# Patient Record
Sex: Male | Born: 1960 | Race: White | Hispanic: No | Marital: Married | Smoking: Former smoker
Health system: Southern US, Community
[De-identification: ages and names within clinical notes are randomized; demographics above are authoritative.]

## PROBLEM LIST (undated history)

## (undated) DIAGNOSIS — D649 Anemia, unspecified: Secondary | ICD-10-CM

## (undated) DIAGNOSIS — R112 Nausea with vomiting, unspecified: Secondary | ICD-10-CM

## (undated) DIAGNOSIS — K267 Chronic duodenal ulcer without hemorrhage or perforation: Principal | ICD-10-CM

## (undated) DIAGNOSIS — N200 Calculus of kidney: Secondary | ICD-10-CM

## (undated) DIAGNOSIS — E559 Vitamin D deficiency, unspecified: Secondary | ICD-10-CM

## (undated) DIAGNOSIS — K219 Gastro-esophageal reflux disease without esophagitis: Secondary | ICD-10-CM

## (undated) DIAGNOSIS — Z87442 Personal history of urinary calculi: Secondary | ICD-10-CM

## (undated) DIAGNOSIS — T7840XA Allergy, unspecified, initial encounter: Secondary | ICD-10-CM

## (undated) DIAGNOSIS — I2510 Atherosclerotic heart disease of native coronary artery without angina pectoris: Secondary | ICD-10-CM

## (undated) DIAGNOSIS — K311 Adult hypertrophic pyloric stenosis: Principal | ICD-10-CM

## (undated) DIAGNOSIS — J189 Pneumonia, unspecified organism: Secondary | ICD-10-CM

## (undated) DIAGNOSIS — K5790 Diverticulosis of intestine, part unspecified, without perforation or abscess without bleeding: Secondary | ICD-10-CM

## (undated) DIAGNOSIS — K279 Peptic ulcer, site unspecified, unspecified as acute or chronic, without hemorrhage or perforation: Secondary | ICD-10-CM

## (undated) DIAGNOSIS — M545 Low back pain: Secondary | ICD-10-CM

## (undated) DIAGNOSIS — D126 Benign neoplasm of colon, unspecified: Secondary | ICD-10-CM

## (undated) DIAGNOSIS — T1490XA Injury, unspecified, initial encounter: Secondary | ICD-10-CM

## (undated) DIAGNOSIS — T4145XA Adverse effect of unspecified anesthetic, initial encounter: Secondary | ICD-10-CM

## (undated) DIAGNOSIS — K859 Acute pancreatitis without necrosis or infection, unspecified: Secondary | ICD-10-CM

## (undated) DIAGNOSIS — I10 Essential (primary) hypertension: Secondary | ICD-10-CM

## (undated) DIAGNOSIS — Z9889 Other specified postprocedural states: Secondary | ICD-10-CM

## (undated) DIAGNOSIS — I219 Acute myocardial infarction, unspecified: Secondary | ICD-10-CM

## (undated) DIAGNOSIS — R7309 Other abnormal glucose: Secondary | ICD-10-CM

## (undated) DIAGNOSIS — S72009A Fracture of unspecified part of neck of unspecified femur, initial encounter for closed fracture: Secondary | ICD-10-CM

## (undated) DIAGNOSIS — T8859XA Other complications of anesthesia, initial encounter: Secondary | ICD-10-CM

## (undated) HISTORY — DX: Low back pain: M54.5

## (undated) HISTORY — DX: Anemia, unspecified: D64.9

## (undated) HISTORY — DX: Chronic duodenal ulcer without hemorrhage or perforation: K26.7

## (undated) HISTORY — DX: Benign neoplasm of colon, unspecified: D12.6

## (undated) HISTORY — DX: Adult hypertrophic pyloric stenosis: K31.1

## (undated) HISTORY — PX: COLONOSCOPY W/ POLYPECTOMY: SHX1380

## (undated) HISTORY — DX: Diverticulosis of intestine, part unspecified, without perforation or abscess without bleeding: K57.90

## (undated) HISTORY — DX: Vitamin D deficiency, unspecified: E55.9

## (undated) HISTORY — DX: Other abnormal glucose: R73.09

## (undated) HISTORY — DX: Fracture of unspecified part of neck of unspecified femur, initial encounter for closed fracture: S72.009A

## (undated) HISTORY — DX: Acute pancreatitis without necrosis or infection, unspecified: K85.90

## (undated) HISTORY — DX: Calculus of kidney: N20.0

## (undated) HISTORY — DX: Injury, unspecified, initial encounter: T14.90XA

## (undated) HISTORY — DX: Essential (primary) hypertension: I10

## (undated) HISTORY — DX: Allergy, unspecified, initial encounter: T78.40XA

## (undated) HISTORY — DX: Gastro-esophageal reflux disease without esophagitis: K21.9

---

## 1991-07-08 HISTORY — PX: KNEE ARTHROSCOPY: SUR90

## 2006-07-07 HISTORY — PX: CHOLECYSTECTOMY: SHX55

## 2009-07-07 DIAGNOSIS — M545 Low back pain, unspecified: Secondary | ICD-10-CM

## 2009-07-07 HISTORY — DX: Low back pain, unspecified: M54.50

## 2011-07-08 DIAGNOSIS — D126 Benign neoplasm of colon, unspecified: Secondary | ICD-10-CM

## 2011-07-08 HISTORY — DX: Benign neoplasm of colon, unspecified: D12.6

## 2012-02-18 ENCOUNTER — Encounter: Payer: Self-pay | Admitting: Internal Medicine

## 2012-03-31 ENCOUNTER — Encounter: Payer: Self-pay | Admitting: Internal Medicine

## 2012-04-02 ENCOUNTER — Encounter: Payer: Self-pay | Admitting: Internal Medicine

## 2012-04-02 ENCOUNTER — Ambulatory Visit (AMBULATORY_SURGERY_CENTER): Payer: BC Managed Care – PPO | Admitting: *Deleted

## 2012-04-02 VITALS — Ht 73.0 in | Wt 230.0 lb

## 2012-04-02 DIAGNOSIS — Z1211 Encounter for screening for malignant neoplasm of colon: Secondary | ICD-10-CM

## 2012-04-02 MED ORDER — SUPREP BOWEL PREP KIT 17.5-3.13-1.6 GM/177ML PO SOLN: ORAL | Status: DC

## 2012-04-16 ENCOUNTER — Ambulatory Visit (AMBULATORY_SURGERY_CENTER): Payer: BC Managed Care – PPO | Admitting: Internal Medicine

## 2012-04-16 ENCOUNTER — Encounter: Payer: Self-pay | Admitting: Internal Medicine

## 2012-04-16 VITALS — BP 151/87 | HR 71 | Temp 98.1°F | Resp 43 | Ht 73.0 in | Wt 230.0 lb

## 2012-04-16 DIAGNOSIS — Z1211 Encounter for screening for malignant neoplasm of colon: Secondary | ICD-10-CM

## 2012-04-16 DIAGNOSIS — D126 Benign neoplasm of colon, unspecified: Secondary | ICD-10-CM

## 2012-04-16 MED ORDER — SODIUM CHLORIDE 0.9 % IV SOLN: 500.0000 mL | INTRAVENOUS | Status: DC

## 2012-04-16 NOTE — Op Note (Signed)
[facility] [address] [employer] [place], [address]   COLONOSCOPY PROCEDURE REPORT  PATIENT: Caleb Mason, Caleb Mason  MR#: [id number] BIRTHDATE: [birth year] , 51  yrs. old GENDER: Male ENDOSCOPIST: Iva Boop, MD, [facility] REFERRED BY:  Donnetta Hutching, MD PROCEDURE DATE:  04/16/2012 PROCEDURE:   Colonoscopy with snare polypectomy ASA CLASS:   Class II INDICATIONS:average risk screening. MEDICATIONS: propofol (Diprivan) 300mg  IV  DESCRIPTION OF PROCEDURE:   After the risks benefits and alternatives of the procedure were thoroughly explained, informed consent was obtained.  A digital rectal exam revealed no abnormalities of the rectum and A digital rectal exam revealed the prostate was not enlarged.   The LB CF-[id number]  endoscope was introduced through the anus and advanced to the cecum, which was identified by both the appendix and ileocecal valve. No adverse events experienced.   The quality of the prep was Suprep excellent The instrument was then slowly withdrawn as the colon was fully examined.      COLON FINDINGS: A polypoid shaped sessile polyp measuring 6 mm in size was found in the sigmoid colon.  A polypectomy was performed with a cold snare.  The resection was complete and the polyp tissue was completely retrieved.   Moderate diverticulosis was noted in the sigmoid colon.   The colon mucosa was otherwise normal. Retroflexed views revealed no abnormalities. The time to cecum=1 minutes 57 seconds.  Withdrawal time=9 minutes 06 seconds.  The scope was withdrawn and the procedure completed. COMPLICATIONS: There were no complications.  ENDOSCOPIC IMPRESSION: 1.   Sessile polyp measuring 6 mm in size was found in the sigmoid colon; polypectomy was performed with a cold snare 2.   Moderate diverticulosis was noted in the sigmoid colon 3.   The colon mucosa was otherwise normal with excellent prep  RECOMMENDATIONS: Timing of repeat colonoscopy will be  determined by pathology findings.   eSigned:  Iva Boop, MD, [facility] 04/16/2012 11:38 AM   cc: The Patient    and Donnetta Hutching, MD   PATIENT NAMEJaizon, Caleb Mason MR#: [id number]

## 2012-04-16 NOTE — Patient Instructions (Addendum)
One benign-appearing polyp was removed. You also have diverticulosis, which is common and not usually a problem.  Please read the patient education handouts.  Thank you for choosing me and [facility].  Iva Boop, MD, FACG   YOU HAD AN ENDOSCOPIC PROCEDURE TODAY AT THE [facility]: Refer to the procedure report that was given to you for any specific questions about what was found during the examination.  If the procedure report does not answer your questions, please call your gastroenterologist to clarify.  If you requested that your care partner not be given the details of your procedure findings, then the procedure report has been included in a sealed envelope for you to review at your convenience later.  YOU SHOULD EXPECT: Some feelings of bloating in the abdomen. Passage of more gas than usual.  Walking can help get rid of the air that was put into your GI tract during the procedure and reduce the bloating. If you had a lower endoscopy (such as a colonoscopy or flexible sigmoidoscopy) you may notice spotting of blood in your stool or on the toilet paper. If you underwent a bowel prep for your procedure, then you may not have a normal bowel movement for a few days.  DIET: Your first meal following the procedure should be a light meal and then it is ok to progress to your normal diet.  A half-sandwich or bowl of soup is an example of a good first meal.  Heavy or fried foods are harder to digest and may make you feel nauseous or bloated.  Likewise meals heavy in dairy and vegetables can cause extra gas to form and this can also increase the bloating.  Drink plenty of fluids but you should avoid alcoholic beverages for 24 hours.  ACTIVITY: Your care partner should take you home directly after the procedure.  You should plan to take it easy, moving slowly for the rest of the day.  You can resume normal activity the day after the procedure however you should NOT DRIVE or  use heavy machinery for 24 hours (because of the sedation medicines used during the test).    SYMPTOMS TO REPORT IMMEDIATELY: A gastroenterologist can be reached at any hour.  During normal business hours, 8:30 AM to 5:00 PM Monday through Friday, call [phone].  After hours and on weekends, please call the GI answering service at [phone] who will take a message and have the physician on call contact you.   Following lower endoscopy (colonoscopy or flexible sigmoidoscopy):  Excessive amounts of blood in the stool  Significant tenderness or worsening of abdominal pains  Swelling of the abdomen that is new, acute  Fever of 100F or higher  FOLLOW UP: If any biopsies were taken you will be contacted by phone or by letter within the next 1-3 weeks.  Call your gastroenterologist if you have not heard about the biopsies in 3 weeks.  Our staff will call the home number listed on your records the next business day following your procedure to check on you and address any questions or concerns that you may have at that time regarding the information given to you following your procedure. This is a courtesy call and so if there is no answer at the home number and we have not heard from you through the emergency physician on call, we will assume that you have returned to your regular daily activities without incident.  SIGNATURES/CONFIDENTIALITY: You and/or your care partner have signed  paperwork which will be entered into your electronic medical record.  These signatures attest to the fact that that the information above on your After Visit Summary has been reviewed and is understood.  Full responsibility of the confidentiality of this discharge information lies with you and/or your care-partner.   Handouts on polyps, diverticulosis, high fiber diet

## 2012-04-16 NOTE — Progress Notes (Signed)
Patient did not experience any of the following events: a burn prior to discharge; a fall within the facility; wrong site/side/patient/procedure/implant event; or a hospital transfer or hospital admission upon discharge from the facility. (G8907) Patient did not have preoperative order for IV antibiotic SSI prophylaxis. (G8918)  

## 2012-04-16 NOTE — Progress Notes (Signed)
The pt tolerated the colonoscopy very well. Maw   

## 2012-04-19 ENCOUNTER — Telehealth: Payer: Self-pay | Admitting: *Deleted

## 2012-04-19 NOTE — Telephone Encounter (Signed)
  Follow up Call-  Call back number 04/16/2012  Post procedure Call Back phone  # [phone]  Permission to leave phone message Yes     Patient questions:  Busy signal x 2.

## 2012-04-20 ENCOUNTER — Encounter: Payer: Self-pay | Admitting: Internal Medicine

## 2012-04-20 DIAGNOSIS — Z860100 Personal history of colon polyps, unspecified: Secondary | ICD-10-CM | POA: Insufficient documentation

## 2012-04-20 NOTE — Progress Notes (Signed)
Quick Note:  6 mm adenoma Repeat colon 2018 ______

## 2012-09-14 ENCOUNTER — Encounter (HOSPITAL_BASED_OUTPATIENT_CLINIC_OR_DEPARTMENT_OTHER): Payer: Self-pay | Admitting: *Deleted

## 2012-09-14 ENCOUNTER — Other Ambulatory Visit: Payer: Self-pay | Admitting: Urology

## 2012-09-14 NOTE — Progress Notes (Signed)
Spoke with wife-to [facility] at 1045-Hg on arrival -Npo after MN-may take pain med with small amt if needed.

## 2012-09-14 NOTE — H&P (Signed)
Patient presents today for outpatient ureteroscopy with probable holmium laser lithotripsy and possible double-J stent placement. He was seen again recently in our office with ongoing flank pain and hematuria. He requested intervention. Recent KUB imaging revealed a difficult to see stone with ongoing hydronephrosis on ultrasound. We did not feel that ESWL was an option given the poor visualization of the stone. We discussed with him the risks and benefits of ureteroscopy and he wishes to proceed. 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 Caleb Mason is currently 52 years of age. He has had several episodes of nephrolithiasis, all with spontaneous passage. These have typically been more associated with excruciating renal colic. More recently he has had nonspecific back pain that has actually alternated sides. He has, however, developed gross hematuria without any dysuria or increased irritative or obstructive voiding symptoms. He has not had any recent imaging. Urinalysis in our office today shows too numerous to count red blood cells. There is no evidence of bacteruria. The patient also has a previous episode of pancreatitis, thought to be possibly secondary to gall stones. He has had his gallbladder removed. He does have a previous tobacco use history of approximately 50-pack-years, quitting about 12 years ago.      Past Medical History Problems  1. History of  Pancreatitis 577.9  Surgical History Problems  1. History of  Cholecystectomy 2. History of  Knee Surgery  Current Meds 1. No Reported Medications  Allergies Medication  1. No Known Drug Allergies  Family History Problems  1. Paternal history of  Family Health Status - Father's Age 35yrs 2. Maternal history of  Family Health  Status - Mother's Age 63yrs 3. Family history of  Family Health Status Number Of Children 2 sons 4. Maternal history of  Ovarian Cancer V16.41 5. Fraternal history of  Testicular Cancer V16.43  Social History Problems    Caffeine Use 4 qd   Former Smoker V15.82 2ppd for 66yrs, nonsmoker for the past 51yrs   Marital History - Currently Married   Occupation: maintence lead Denied    History of  Alcohol Use  Review of Systems Genitourinary, constitutional, skin, eye, otolaryngeal, hematologic/lymphatic, cardiovascular, pulmonary, endocrine, musculoskeletal, gastrointestinal, neurological and psychiatric system(s) were reviewed and pertinent findings if present are noted.  Genitourinary: urinary frequency, nocturia and hematuria.  Gastrointestinal: nausea and flank pain.  Musculoskeletal: back pain.    Vitals Vital Signs [Data Includes: Last 1 Day]  25Feb2014 10:12AM  BMI Calculated: 32.86 BSA Calculated: 2.29 Height: 5 ft 11.5 in Weight: 240 lb  Blood Pressure: 132 / 88 Temperature: 98.4 F Heart Rate: 68  Physical Exam Constitutional: Well nourished and well developed . No acute distress.  ENT:. The ears and nose are normal in appearance.  Neck: The appearance of the neck is normal and no neck mass is present.  Pulmonary: No respiratory distress and normal respiratory rhythm and effort.  Cardiovascular: Heart rate and rhythm are normal . No peripheral edema.  Abdomen: The abdomen is soft and nontender. No masses are palpated. No CVA tenderness. No hernias are palpable. No hepatosplenomegaly noted.  Rectal: Rectal exam demonstrates normal sphincter tone, no tenderness and no masses. Estimated prostate size is 1+. Normal rectal tone, no rectal masses, prostate is smooth, symmetric and non-tender. The prostate has no nodularity and is not  tender. The left seminal vesicle is nonpalpable. The right seminal vesicle is nonpalpable. The perineum is normal on inspection.   Genitourinary: Examination of the penis demonstrates no discharge, no masses, no lesions and a normal meatus. The scrotum is without lesions. The right epididymis is palpably normal and non-tender. The left epididymis is palpably normal and non-tender. The right testis is non-tender and without masses. The left testis is non-tender and without masses.  Lymphatics: The femoral and inguinal nodes are not enlarged or tender.  Skin: Normal skin turgor, no visible rash and no visible skin lesions.  Neuro/Psych:. Mood and affect are appropriate.    Results/Data Urine [Data Includes: Last 1 Day]   25Feb2014  COLOR AMBER   APPEARANCE CLOUDY   SPECIFIC GRAVITY 1.030   pH 6.0   GLUCOSE NEG mg/dL  BILIRUBIN NEG   KETONE NEG mg/dL  BLOOD LARGE   PROTEIN NEG mg/dL  UROBILINOGEN 0.2 mg/dL  NITRITE NEG   LEUKOCYTE ESTERASE NEG   SQUAMOUS EPITHELIAL/HPF RARE   WBC 0-2 WBC/hpf  RBC TNTC RBC/hpf  BACTERIA FEW   CRYSTALS Calcium Oxalate crystals noted   CASTS NONE SEEN    Assessment Assessed  1. Nephrolithiasis 592.0 2. Gross Hematuria 599.71 3. Ureteral Stone 592.1  Plan Gross Hematuria (599.71)  1. Tamsulosin HCl 0.4 MG Oral Capsule; TAKE 1 CAPSULE EVERY DAY; Therapy: 25Feb2014 to  (Last Rx:25Feb2014) 2. AU CT-STONE PROTOCOL  Done: 25Feb2014 12:00AM Ureteral Stone (592.1)  3. KUB  Requested for: 25Feb2014 4. Follow-up NP/PA Office  Follow-up  Requested for: 25Feb2014  Discussion/Summary   Mr. Caleb Mason underwent a stone protocol CT today. The official report from the radiologist is not yet back but what I have reviewed, he clearly has a 4 mm proximal right ureteral stone. It does not appear to be causing high-grade obstruction. There are no other renal or ureteral calculi noted. We will check the official report from the radiologist and let him know if there is any difference in their interpretation. In the interim, I do not see an absolute indication for intervention at this time. The  stone is difficult to see on the scout imaging because of some bowel gas. He hopefully will be able to pass this spontaneously. He does have pain medication at home, but we will put him on an alpha-blocker and provide him with a urine strainer. Followup with KUB with one of our physician extenders in 2-3 weeks and we will hear sooner if he is not doing well clinically. If the stone does not pass, then ESWL versus ureteroscopy will depend on how well the stone is seen on KUB and its location.    Signatures Electronically signed by : Barron Alvine, M.D.; Aug 31 2012  5:08PM

## 2012-09-15 ENCOUNTER — Encounter (HOSPITAL_BASED_OUTPATIENT_CLINIC_OR_DEPARTMENT_OTHER)
Admission: RE | Disposition: A | Discharge status: Home / Self Care | Payer: Self-pay | Source: Ambulatory Visit | Attending: Urology

## 2012-09-15 ENCOUNTER — Ambulatory Visit (HOSPITAL_BASED_OUTPATIENT_CLINIC_OR_DEPARTMENT_OTHER): Payer: BC Managed Care – PPO | Admitting: Anesthesiology

## 2012-09-15 ENCOUNTER — Ambulatory Visit (HOSPITAL_BASED_OUTPATIENT_CLINIC_OR_DEPARTMENT_OTHER)
Admission: RE | Admit: 2012-09-15 | Discharge: 2012-09-15 | Disposition: A | Discharge status: Home / Self Care | Payer: BC Managed Care – PPO | Source: Ambulatory Visit | Attending: Urology | Admitting: Urology

## 2012-09-15 ENCOUNTER — Encounter (HOSPITAL_BASED_OUTPATIENT_CLINIC_OR_DEPARTMENT_OTHER): Payer: Self-pay | Admitting: Anesthesiology

## 2012-09-15 DIAGNOSIS — N201 Calculus of ureter: Secondary | ICD-10-CM | POA: Insufficient documentation

## 2012-09-15 HISTORY — PX: CYSTOSCOPY WITH RETROGRADE PYELOGRAM, URETEROSCOPY AND STENT PLACEMENT: SHX5789

## 2012-09-15 HISTORY — PX: HOLMIUM LASER APPLICATION: SHX5852

## 2012-09-15 LAB — POCT HEMOGLOBIN-HEMACUE: Hemoglobin: 15 g/dL (ref 13.0–17.0)

## 2012-09-15 SURGERY — CYSTOURETEROSCOPY, WITH RETROGRADE PYELOGRAM AND STENT INSERTION
Anesthesia: General | Site: Ureter | Laterality: Right | Wound class: Clean Contaminated

## 2012-09-15 MED ORDER — BELLADONNA ALKALOIDS-OPIUM 16.2-60 MG RE SUPP
RECTAL | Status: DC | PRN
  Administered 2012-09-15: 1 via RECTAL

## 2012-09-15 MED ORDER — KETOROLAC TROMETHAMINE 30 MG/ML IJ SOLN
INTRAMUSCULAR | Status: DC | PRN
  Administered 2012-09-15: 30 mg via INTRAVENOUS

## 2012-09-15 MED ORDER — DEXAMETHASONE SODIUM PHOSPHATE 4 MG/ML IJ SOLN
INTRAMUSCULAR | Status: DC | PRN
  Administered 2012-09-15: 10 mg via INTRAVENOUS

## 2012-09-15 MED ORDER — LIDOCAINE HCL (CARDIAC) 20 MG/ML IV SOLN
INTRAVENOUS | Status: DC | PRN
  Administered 2012-09-15: 100 mg via INTRAVENOUS

## 2012-09-15 MED ORDER — URIBEL 118 MG PO CAPS: 1.0000 | ORAL_CAPSULE | Freq: Three times a day (TID) | ORAL | Status: DC | PRN

## 2012-09-15 MED ORDER — MIDAZOLAM HCL 5 MG/5ML IJ SOLN
INTRAMUSCULAR | Status: DC | PRN
  Administered 2012-09-15: 2 mg via INTRAVENOUS

## 2012-09-15 MED ORDER — PROPOFOL 10 MG/ML IV BOLUS
INTRAVENOUS | Status: DC | PRN
  Administered 2012-09-15: 350 mg via INTRAVENOUS

## 2012-09-15 MED ORDER — URELLE 81 MG PO TABS
1.0000 | ORAL_TABLET | Freq: Four times a day (QID) | ORAL | Status: DC
  Administered 2012-09-15: 81 mg via ORAL
  Filled 2012-09-15: qty 1

## 2012-09-15 MED ORDER — ONDANSETRON HCL 4 MG/2ML IJ SOLN
INTRAMUSCULAR | Status: DC | PRN
  Administered 2012-09-15: 4 mg via INTRAVENOUS

## 2012-09-15 MED ORDER — HYDROCODONE-ACETAMINOPHEN 5-325 MG PO TABS: 1.0000 | ORAL_TABLET | Freq: Four times a day (QID) | ORAL | Status: DC | PRN

## 2012-09-15 MED ORDER — LACTATED RINGERS IV SOLN
INTRAVENOUS | Status: DC
  Administered 2012-09-15: 11:00:00 via INTRAVENOUS
  Filled 2012-09-15: qty 1000

## 2012-09-15 MED ORDER — CIPROFLOXACIN IN D5W 400 MG/200ML IV SOLN
400.0000 mg | INTRAVENOUS | Status: AC
  Administered 2012-09-15: 400 mg via INTRAVENOUS
  Filled 2012-09-15: qty 200

## 2012-09-15 MED ORDER — LACTATED RINGERS IV SOLN
INTRAVENOUS | Status: DC
  Administered 2012-09-15: 14:00:00 via INTRAVENOUS
  Filled 2012-09-15: qty 1000

## 2012-09-15 MED ORDER — FENTANYL CITRATE 0.05 MG/ML IJ SOLN
25.0000 ug | INTRAMUSCULAR | Status: DC | PRN
  Filled 2012-09-15: qty 1

## 2012-09-15 MED ORDER — SODIUM CHLORIDE 0.9 % IR SOLN
Status: DC | PRN
  Administered 2012-09-15: 6000 mL

## 2012-09-15 MED ORDER — LIDOCAINE HCL 2 % EX GEL
CUTANEOUS | Status: DC | PRN
  Administered 2012-09-15: 1 via URETHRAL

## 2012-09-15 MED ORDER — IOHEXOL 300 MG/ML  SOLN
INTRAMUSCULAR | Status: DC | PRN
  Administered 2012-09-15: 10 mL

## 2012-09-15 MED ORDER — FENTANYL CITRATE 0.05 MG/ML IJ SOLN
INTRAMUSCULAR | Status: DC | PRN
  Administered 2012-09-15 (×2): 50 ug via INTRAVENOUS

## 2012-09-15 SURGICAL SUPPLY — 35 items
ADAPTER CATH URET PLST 4-6FR (CATHETERS) IMPLANT
BAG DRAIN URO-CYSTO SKYTR STRL (DRAIN) ×2 IMPLANT
BASKET LASER NITINOL 1.9FR (BASKET) IMPLANT
BASKET STNLS GEMINI 4WIRE 3FR (BASKET) IMPLANT
BASKET ZERO TIP NITINOL 2.4FR (BASKET) ×2 IMPLANT
BENZOIN TINCTURE PRP APPL 2/3 (GAUZE/BANDAGES/DRESSINGS) ×2 IMPLANT
CANISTER SUCT LVC 12 LTR MEDI- (MISCELLANEOUS) ×2 IMPLANT
CATH INTERMIT  6FR 70CM (CATHETERS) ×2 IMPLANT
CATH URET 5FR 28IN CONE TIP (BALLOONS)
CATH URET 5FR 28IN OPEN ENDED (CATHETERS) IMPLANT
CATH URET 5FR 70CM CONE TIP (BALLOONS) IMPLANT
CLOTH BEACON ORANGE TIMEOUT ST (SAFETY) ×2 IMPLANT
DRAPE CAMERA CLOSED 9X96 (DRAPES) ×2 IMPLANT
DRSG TEGADERM 2-3/8X2-3/4 SM (GAUZE/BANDAGES/DRESSINGS) ×2 IMPLANT
GLOVE BIO SURGEON STRL SZ7.5 (GLOVE) ×2 IMPLANT
GLOVE BIOGEL PI IND STRL 7.5 (GLOVE) ×1 IMPLANT
GLOVE BIOGEL PI INDICATOR 7.5 (GLOVE) ×1
GLOVE ECLIPSE 6.5 STRL STRAW (GLOVE) ×2 IMPLANT
GOWN STRL REIN XL XLG (GOWN DISPOSABLE) ×2 IMPLANT
GUIDEWIRE 0.038 PTFE COATED (WIRE) IMPLANT
GUIDEWIRE ANG ZIPWIRE 038X150 (WIRE) IMPLANT
GUIDEWIRE STR DUAL SENSOR (WIRE) ×2 IMPLANT
IV NS IRRIG 3000ML ARTHROMATIC (IV SOLUTION) ×4 IMPLANT
KIT BALLIN UROMAX 15FX10 (LABEL) IMPLANT
KIT BALLN UROMAX 15FX4 (MISCELLANEOUS) IMPLANT
KIT BALLN UROMAX 26 75X4 (MISCELLANEOUS)
LASER FIBER DISP (UROLOGICAL SUPPLIES) ×2 IMPLANT
LASER FIBER DISP 1000U (UROLOGICAL SUPPLIES) IMPLANT
NS IRRIG 500ML POUR BTL (IV SOLUTION) ×2 IMPLANT
PACK CYSTOSCOPY (CUSTOM PROCEDURE TRAY) ×2 IMPLANT
SET HIGH PRES BAL DIL (LABEL)
SHEATH ACCESS URETERAL 38CM (SHEATH) IMPLANT
SHEATH ACCESS URETERAL 54CM (SHEATH) IMPLANT
SHEATH URET ACCESS 12FR/35CM (UROLOGICAL SUPPLIES) ×2 IMPLANT
STENT URET 6FRX26 CONTOUR (STENTS) ×2 IMPLANT

## 2012-09-15 NOTE — Anesthesia Preprocedure Evaluation (Addendum)
Anesthesia Evaluation  Patient identified by MRN, date of birth, ID band Patient awake    Reviewed: Allergy & Precautions, H&P , NPO status , Patient's Chart, lab work & pertinent test results  Airway Mallampati: II TM Distance: >3 FB Neck ROM: full    Dental  (+) Dental Advisory Given and Chipped Chipped 2 front upper.  Right shorter than left.:   Pulmonary neg pulmonary ROS,  breath sounds clear to auscultation  Pulmonary exam normal       Cardiovascular Exercise Tolerance: Good negative cardio ROS  Rhythm:regular Rate:Normal     Neuro/Psych negative neurological ROS  negative psych ROS   GI/Hepatic negative GI ROS, Neg liver ROS,   Endo/Other  negative endocrine ROS  Renal/GU negative Renal ROS  negative genitourinary   Musculoskeletal   Abdominal   Peds  Hematology negative hematology ROS (+)   Anesthesia Other Findings   Reproductive/Obstetrics negative OB ROS                          Anesthesia Physical Anesthesia Plan  ASA: I  Anesthesia Plan: General   Post-op Pain Management:    Induction: Intravenous  Airway Management Planned: LMA  Additional Equipment:   Intra-op Plan:   Post-operative Plan:   Informed Consent: I have reviewed the patients History and Physical, chart, labs and discussed the procedure including the risks, benefits and alternatives for the proposed anesthesia with the patient or authorized representative who has indicated his/her understanding and acceptance.   Dental Advisory Given  Plan Discussed with: CRNA and Surgeon  Anesthesia Plan Comments:         Anesthesia Quick Evaluation

## 2012-09-15 NOTE — Anesthesia Procedure Notes (Signed)
Procedure Name: LMA Insertion Date/Time: 09/15/2012 11:58 AM Performed by: Norva Pavlov Pre-anesthesia Checklist: Patient identified, Emergency Drugs available, Suction available and Patient being monitored Patient Re-evaluated:Patient Re-evaluated prior to inductionOxygen Delivery Method: Circle System Utilized Preoxygenation: Pre-oxygenation with 100% oxygen Intubation Type: IV induction Ventilation: Mask ventilation without difficulty LMA: LMA inserted LMA Size: 5.0 Number of attempts: 1 Airway Equipment and Method: bite block Placement Confirmation: positive ETCO2 Tube secured with: Tape Dental Injury: Teeth and Oropharynx as per pre-operative assessment

## 2012-09-15 NOTE — Interval H&P Note (Signed)
History and Physical Interval Note:  09/15/2012 11:47 AM  Caleb Mason  has presented today for surgery, with the diagnosis of right ureteral stone  The various methods of treatment have been discussed with the patient and family. After consideration of risks, benefits and other options for treatment, the patient has consented to  Procedure(s): CYSTOSCOPY WITH RETROGRADE PYELOGRAM, URETEROSCOPY AND STENT PLACEMENT (Right) HOLMIUM LASER APPLICATION (Right) as a surgical intervention .  The patient's history has been reviewed, patient examined, no change in status, stable for surgery.  I have reviewed the patient's chart and labs.  Questions were answered to the patient's satisfaction.     GRAPEY,DAVID S

## 2012-09-15 NOTE — Anesthesia Postprocedure Evaluation (Signed)
  Anesthesia Post-op Note  Patient: Caleb Mason  Procedure(s) Performed: Procedure(s) (LRB): CYSTOSCOPY WITH right  RETROGRADE PYELOGRAM, right  URETEROSCOPY AND STENT PLACEMENT (Right) HOLMIUM LASER APPLICATION (Right)  Patient Location: PACU  Anesthesia Type: General  Level of Consciousness: awake and alert   Airway and Oxygen Therapy: Patient Spontanous Breathing  Post-op Pain: mild  Post-op Assessment: Post-op Vital signs reviewed, Patient's Cardiovascular Status Stable, Respiratory Function Stable, Patent Airway and No signs of Nausea or vomiting  Last Vitals:  Filed Vitals:   09/15/12 1315  BP: 133/72  Pulse: 64  Temp:   Resp: 10    Post-op Vital Signs: stable   Complications: No apparent anesthesia complications

## 2012-09-15 NOTE — Op Note (Signed)
Preoperative diagnosis: R ureteral calculus  Postoperative diagnosis: R ureteral calculus  Procedure:  1. Cystoscopy 2. R ureteroscopy and stone removal 3. Ureteroscopic laser lithotripsy 4. R ureteral stent placement (26CM) 71f 5. R retrograde pyelography with interpretation  Surgeon: Valetta Fuller, MD  Anesthesia: General  Complications: None  Intraoperative findings: R retrograde pyelography demonstrated a filling defect within the R ureter consistent with the patient's known calculus without other abnormalities.  EBL: Minimal  Specimens: 1. R ureteral calculus  Disposition of specimens: [facility] for stone analysis  Indication: Caleb Mason is a 52 y.o.   patient with urolithiasis. After reviewing the management options for treatment, the patient elected to proceed with the above surgical procedure(s). We have discussed the potential benefits and risks of the procedure, side effects of the proposed treatment, the likelihood of the patient achieving the goals of the procedure, and any potential problems that might occur during the procedure or recuperation. Informed consent has been obtained.  Description of procedure:  The patient was taken to the operating room and general anesthesia was induced.  The patient was placed in the dorsal lithotomy position, prepped and draped in the usual sterile fashion, and preoperative antibiotics were administered. A preoperative time-out was performed.   Cystourethroscopy was performed.  The patient's urethra was examined and was normal. The bladder was then systematically examined in its entirety. There was no evidence for any bladder tumors, stones, or other mucosal pathology.    Attention then turned to the R ureteral orifice and a ureteral catheter was used to intubate the ureteral orifice.  Omnipaque contrast was injected through the ureteral catheter and a retrograde pyelogram was performed with findings as dictated  above.  A 0.38 sensor guidewire was then advanced up the R ureter into the renal pelvis under fluoroscopic guidance. The 6 Fr semirigid ureteroscope was then advanced into the ureter next to the guidewire and the calculus was identified.   The stone was then fragmented with the 365 micron holmium laser fiber on a setting of 0.8J and frequency of 8 Hz.   All stones were then removed from the ureter with a zero tip nitinol basket.  Reinspection of the ureter revealed no remaining visible stones or fragments.   The wire was then backloaded through the cystoscope and a ureteral stent was advance over the wire using Seldinger technique.  The stent was positioned appropriately under fluoroscopic and cystoscopic guidance.  The wire was then removed with an adequate stent curl noted in the renal pelvis as well as in the bladder.  The bladder was then emptied and the procedure ended.  The patient appeared to tolerate the procedure well and without complications.  The patient was able to be awakened and transferred to the recovery unit in satisfactory condition.

## 2012-09-15 NOTE — Interval H&P Note (Signed)
History and Physical Interval Note:  09/15/2012 11:46 AM  Caleb Mason  has presented today for surgery, with the diagnosis of right ureteral stone  The various methods of treatment have been discussed with the patient and family. After consideration of risks, benefits and other options for treatment, the patient has consented to  Procedure(s): CYSTOSCOPY WITH RETROGRADE PYELOGRAM, URETEROSCOPY AND STENT PLACEMENT (Right) HOLMIUM LASER APPLICATION (Right) as a surgical intervention .  The patient's history has been reviewed, patient examined, no change in status, stable for surgery.  I have reviewed the patient's chart and labs.  Questions were answered to the patient's satisfaction.     GRAPEY,DAVID S

## 2012-09-15 NOTE — Transfer of Care (Signed)
Immediate Anesthesia Transfer of Care Note  Patient: Caleb Mason  Procedure(s) Performed: Procedure(s) (LRB): CYSTOSCOPY WITH right  RETROGRADE PYELOGRAM, right  URETEROSCOPY AND STENT PLACEMENT (Right) HOLMIUM LASER APPLICATION (Right)  Patient Location: Patient transported to PACU with oxygen via face mask at 4 Liters / Min  Anesthesia Type: General  Level of Consciousness: awake and alert   Airway & Oxygen Therapy: Patient Spontanous Breathing and Patient connected to face mask oxygen  Post-op Assessment: Report given to PACU RN and Post -op Vital signs reviewed and stable  Post vital signs: Reviewed and stable  Dentition: Teeth and oropharynx remain in pre-op condition  Complications: No apparent anesthesia complications

## 2012-09-16 ENCOUNTER — Encounter (HOSPITAL_BASED_OUTPATIENT_CLINIC_OR_DEPARTMENT_OTHER): Payer: Self-pay | Admitting: Urology

## 2012-09-16 NOTE — Addendum Note (Signed)
Addendum created 09/16/12 0744 by Fran Lowes, CRNA   Modules edited: Charges VN

## 2015-01-25 ENCOUNTER — Encounter: Payer: Self-pay | Admitting: Internal Medicine

## 2015-04-03 ENCOUNTER — Encounter: Payer: Self-pay | Admitting: Internal Medicine

## 2015-04-03 ENCOUNTER — Ambulatory Visit (INDEPENDENT_AMBULATORY_CARE_PROVIDER_SITE_OTHER): Payer: BLUE CROSS/BLUE SHIELD | Admitting: Internal Medicine

## 2015-04-03 VITALS — BP 128/84 | HR 80 | Ht 70.5 in | Wt 252.5 lb

## 2015-04-03 DIAGNOSIS — R194 Change in bowel habit: Secondary | ICD-10-CM

## 2015-04-03 DIAGNOSIS — Z86010 Personal history of colonic polyps: Secondary | ICD-10-CM

## 2015-04-03 DIAGNOSIS — K5732 Diverticulitis of large intestine without perforation or abscess without bleeding: Secondary | ICD-10-CM | POA: Diagnosis not present

## 2015-04-03 NOTE — Patient Instructions (Signed)
You have been scheduled for a colonoscopy. Please follow written instructions given to you at your visit today.  Please pick up your over the counter prep supplies at the pharmacy. If you use inhalers (even only as needed), please bring them with you on the day of your procedure.  Today you signed a ROI for [place] to obtain your CT done in [place].   We have given you handouts to read on Diverticulitis, and High fiber diet.  Also a handout on Benefiber, take 2 tablespoons daily.   I appreciate the opportunity to care for you. Silvano Rusk, MD, [facility]

## 2015-04-03 NOTE — Progress Notes (Signed)
   Subjective:    Patient ID: Caleb Mason, male    DOB: Feb 03, 1961, 54 y.o.   MRN: [id number] Cc: diverticulitis HPI In past year has had 3 episodes of LLQ pain and constipation w/ thin stools. Severe episode 2015 - CT showed diverticulitis. Each time better w/ cipro and metronidazole. Last episode months ago. He is here asking why and what to do. Now has intermittent days w/o defecation x 1-2 then has multiple stools. Also c/o incomplete defecation at times. Has gained weight. Diet relatively devoid of fiber it sounds like. GI ROS o/w neg at this time  Medications, allergies, past medical history, past surgical history, family history and social history are reviewed and updated in the EMR.   Review of Systems Eyeglasses, all other ROS neg or per HPI    Objective:   Physical Exam @BP  128/84 mmHg  Pulse 80  Ht 5' 10.5" (1.791 m)  Wt 252 lb 8 oz (114.533 kg)  BMI 35.71 kg/m2@  General:  Well-developed, well-nourished and in no acute distress Eyes:  anicteric. Lungs: Clear to auscultation bilaterally. Heart:  S1S2, no rubs, murmurs, gallops. Abdomen:  soft, non-tender, no hepatosplenomegaly, hernia, or mass and BS+.  Rectal: Deferred until colonoscopy Neuro:  A&O x 3.    Data Reviewed: Will request CT report 2015 - 03/27/2014 - inflammatory strranding and diverticulosis in sigmoid colon 2013 colonoscopy 6 mm adenoma and diverticulosis Had nausea post that     Assessment & Plan:  Change in bowel habits  Diverticulitis large intestine w/o perforation or abscess w/o bleeding  Hx of adenomatous polyp of colon  Schedule colonoscopy to evaluate change in bowel habits Start high fiber plus benefiber 2 tbsp/day The risks and benefits as well as alternatives of endoscopic procedure(s) have been discussed and reviewed. All questions answered. The patient agrees to proceed.  Marland Kitchen

## 2015-04-04 ENCOUNTER — Encounter: Payer: Self-pay | Admitting: Internal Medicine

## 2015-05-01 ENCOUNTER — Encounter: Payer: Self-pay | Admitting: Internal Medicine

## 2015-05-09 ENCOUNTER — Ambulatory Visit (AMBULATORY_SURGERY_CENTER): Payer: BLUE CROSS/BLUE SHIELD | Admitting: Internal Medicine

## 2015-05-09 ENCOUNTER — Encounter: Payer: Self-pay | Admitting: Internal Medicine

## 2015-05-09 VITALS — BP 146/97 | HR 72 | Temp 96.3°F | Resp 10 | Ht 70.0 in | Wt 252.0 lb

## 2015-05-09 DIAGNOSIS — K5730 Diverticulosis of large intestine without perforation or abscess without bleeding: Secondary | ICD-10-CM | POA: Diagnosis not present

## 2015-05-09 DIAGNOSIS — R194 Change in bowel habit: Secondary | ICD-10-CM | POA: Diagnosis not present

## 2015-05-09 DIAGNOSIS — Z86010 Personal history of colonic polyps: Secondary | ICD-10-CM | POA: Diagnosis not present

## 2015-05-09 MED ORDER — SODIUM CHLORIDE 0.9 % IV SOLN: 500.0000 mL | INTRAVENOUS | Status: DC

## 2015-05-09 NOTE — Patient Instructions (Addendum)
The colonoscopy shows diverticulosis. No polyps or cancer seen. Please eat a high fiber diet and take Benefiber 1-3 tablespoons a day if you need to add that to have more regular bowel movements. If that does not do it take MiraLax daily or as often as needed to help.  Next routine colonoscopy in 10 years - 2026  I appreciate the opportunity to care for you. Gatha Mayer, MD, FACG      YOU HAD AN ENDOSCOPIC PROCEDURE TODAY AT [facility]:   Refer to the procedure report that was given to you for any specific questions about what was found during the examination.  If the procedure report does not answer your questions, please call your gastroenterologist to clarify.  If you requested that your care partner not be given the details of your procedure findings, then the procedure report has been included in a sealed envelope for you to review at your convenience later.  YOU SHOULD EXPECT: Some feelings of bloating in the abdomen. Passage of more gas than usual.  Walking can help get rid of the air that was put into your GI tract during the procedure and reduce the bloating. If you had a lower endoscopy (such as a colonoscopy or flexible sigmoidoscopy) you may notice spotting of blood in your stool or on the toilet paper. If you underwent a bowel prep for your procedure, you may not have a normal bowel movement for a few days.  Please Note:  You might notice some irritation and congestion in your nose or some drainage.  This is from the oxygen used during your procedure.  There is no need for concern and it should clear up in a day or so.  SYMPTOMS TO REPORT IMMEDIATELY:   Following lower endoscopy (colonoscopy or flexible sigmoidoscopy):  Excessive amounts of blood in the stool  Significant tenderness or worsening of abdominal pains  Swelling of the abdomen that is new, acute  Fever of 100F or higher   For urgent or emergent issues, a gastroenterologist can be  reached at any hour by calling [phone].   DIET: Your first meal following the procedure should be a small meal and then it is ok to progress to your normal diet. Heavy or fried foods are harder to digest and may make you feel nauseous or bloated.  Likewise, meals heavy in dairy and vegetables can increase bloating.  Drink plenty of fluids but you should avoid alcoholic beverages for 24 hours.  ACTIVITY:  You should plan to take it easy for the rest of today and you should NOT DRIVE or use heavy machinery until tomorrow (because of the sedation medicines used during the test).    FOLLOW UP: Our staff will call the number listed on your records the next business day following your procedure to check on you and address any questions or concerns that you may have regarding the information given to you following your procedure. If we do not reach you, we will leave a message.  However, if you are feeling well and you are not experiencing any problems, there is no need to return our call.  We will assume that you have returned to your regular daily activities without incident.  If any biopsies were taken you will be contacted by phone or by letter within the next 1-3 weeks.  Please call us at [phone] if you have not heard about the biopsies in 3 weeks.    SIGNATURES/CONFIDENTIALITY: You and/or  your care partner have signed paperwork which will be entered into your electronic medical record.  These signatures attest to the fact that that the information above on your After Visit Summary has been reviewed and is understood.  Full responsibility of the confidentiality of this discharge information lies with you and/or your care-partner.    Handouts were given to your care partner on  diverticulosis and a high fiber diet with liberal fluid intake. Add Benefiber 1-3 tbsp and possibly Miralax if needed. You may resume your current medications today. Please call if any questions or  concerns.

## 2015-05-09 NOTE — Progress Notes (Signed)
Report to PACU, RN, vss, BBS= Clear.  

## 2015-05-09 NOTE — Op Note (Signed)
[place] [place]  [address] [place], [id number]   COLONOSCOPY PROCEDURE REPORT  PATIENT: Caleb Mason, Caleb Mason  MR#: [id number] BIRTHDATE: 04/20/1961 , 54  yrs. old GENDER: male ENDOSCOPIST: Gatha Mayer, MD, [facility] PROCEDURE DATE:  05/09/2015 PROCEDURE:   Colonoscopy, diagnostic First Screening Colonoscopy - Avg.  risk and is 50 yrs.  old or older - No.  Prior Negative Screening - Now for repeat screening. N/A  History of Adenoma - Now for follow-up colonoscopy & has been > or = to 3 yrs.  Yes hx of adenoma.  Has been 3 or more years since last colonoscopy.  Polyps removed today? No Recommend repeat exam, <10 yrs? No ASA CLASS:   Class II INDICATIONS:Change in bowel habits MEDICATIONS: Propofol 300 mg IV and Monitored anesthesia care  DESCRIPTION OF PROCEDURE:   After the risks benefits and alternatives of the procedure were thoroughly explained, informed consent was obtained.  The digital rectal exam revealed no abnormalities of the rectum, revealed no prostatic nodules, and revealed the prostate was not enlarged.   The LB [id number] endoscope was introduced through the anus and advanced to the cecum, which was identified by both the appendix and ileocecal valve. No adverse events experienced.   The quality of the prep was excellent.  (MiraLax was used)  The instrument was then slowly withdrawn as the colon was fully examined. Estimated blood loss is zero unless otherwise noted in this procedure report.      COLON FINDINGS: There was diverticulosis noted in the sigmoid colon. The examination was otherwise normal.  Retroflexed views revealed no abnormalities. The time to cecum = 2.6 Withdrawal time = 9.6 The scope was withdrawn and the procedure completed. COMPLICATIONS: There were no immediate complications.  ENDOSCOPIC IMPRESSION: 1.   Diverticulosis was noted in the sigmoid colon 2.   The examination was otherwise normal - excellent  prep  RECOMMENDATIONS: 1.  High fiber diet 2.  Add benefiber 1-3 tbsp and possibly MiraLax if needed 3.  Repeat Colonscopy in 10 years - 2026.  (Only one 6 mm adenoma in 2013)  eSigned:  Gatha Mayer, MD, [facility] 05/09/2015 10:58 AM   cc: Dr. Lucianne Lei and The Patient

## 2015-05-09 NOTE — Progress Notes (Signed)
Pt's abd firm and he has not expelled a great deal of gas out.  After time up, pt to the rest room to sit after levsin subling.  Pt said he passed a little air out.  Pt back to bed and Dr. Carlean Purl said ok to d/c to home.  I advised to get on his hands and knees on the floor and rock back and forth.  Try warm fluids to drink.  To call us back if needed.  Pt wanted to be discharged.  maw

## 2015-05-10 ENCOUNTER — Telehealth: Payer: Self-pay | Admitting: *Deleted

## 2015-05-10 NOTE — Telephone Encounter (Signed)
  Follow up Call-  Call back number 05/09/2015  Post procedure Call Back phone  # [phone]  Permission to leave phone message Yes    [facility]

## 2015-11-26 ENCOUNTER — Ambulatory Visit (INDEPENDENT_AMBULATORY_CARE_PROVIDER_SITE_OTHER): Payer: BLUE CROSS/BLUE SHIELD | Admitting: Physician Assistant

## 2015-11-26 ENCOUNTER — Encounter: Payer: Self-pay | Admitting: Physician Assistant

## 2015-11-26 ENCOUNTER — Telehealth: Payer: Self-pay | Admitting: Internal Medicine

## 2015-11-26 VITALS — BP 136/84 | HR 72 | Ht 70.5 in | Wt 252.8 lb

## 2015-11-26 DIAGNOSIS — K5732 Diverticulitis of large intestine without perforation or abscess without bleeding: Secondary | ICD-10-CM | POA: Diagnosis not present

## 2015-11-26 DIAGNOSIS — R1032 Left lower quadrant pain: Secondary | ICD-10-CM

## 2015-11-26 MED ORDER — CIPROFLOXACIN HCL 500 MG PO TABS: 500.0000 mg | ORAL_TABLET | Freq: Two times a day (BID) | ORAL | Status: DC

## 2015-11-26 MED ORDER — METRONIDAZOLE 500 MG PO TABS: 500.0000 mg | ORAL_TABLET | Freq: Two times a day (BID) | ORAL | Status: AC

## 2015-11-26 MED ORDER — CIPROFLOXACIN HCL 500 MG PO TABS: 500.0000 mg | ORAL_TABLET | Freq: Two times a day (BID) | ORAL | Status: AC

## 2015-11-26 MED ORDER — METRONIDAZOLE 500 MG PO TABS: 500.0000 mg | ORAL_TABLET | Freq: Two times a day (BID) | ORAL | Status: DC

## 2015-11-26 NOTE — Patient Instructions (Signed)
We sent prescriptions to [facility]. 1. Cipro 500 mg 2. Metronidazole 500 mg

## 2015-11-26 NOTE — Progress Notes (Signed)
Patient ID: Caleb Mason, male   DOB: 10-Jun-1961, 55 y.o.   MRN: [id number]   Subjective:    Patient ID: Welford Roche, male    DOB: 10-22-1960, 55 y.o.   MRN: [id number]  HPI  Chukwuemeka is a pleasant 55 year old white male known to Dr. Carlean Purl. He had undergone colonoscopy in November 2016 which was normal with the exception of sigmoid diverticulosis. Patient says that he has had for 5 episodes at least of diverticulitis in the past had done well for several years and then last year probably had 3 episodes. He has been taking Metamucil on a daily basis and said he had done well until this past again noticing left lower quadrant discomfort which became progressively severe by Saturday evening. He says it was constant and he was unable to sleep that night. He describes it as a constant pressure-like pain. His appetite is been somewhat decreased he had not had any fever or chills no diarrhea melena or hematochezia. He says he always feels bloated and uncomfortable with these episodes.  Review of Systems Pertinent positive and negative review of systems were noted in the above HPI section.  All other review of systems was otherwise negative.  Outpatient Encounter Prescriptions as of 11/26/2015  Medication Sig  . psyllium (METAMUCIL) 58.6 % powder Take 1 packet by mouth daily.  . ciprofloxacin (CIPRO) 500 MG tablet Take 1 tablet (500 mg total) by mouth 2 (two) times daily.  . metroNIDAZOLE (FLAGYL) 500 MG tablet Take 1 tablet (500 mg total) by mouth 2 (two) times daily.  . [DISCONTINUED] ciprofloxacin (CIPRO) 500 MG tablet Take 1 tablet (500 mg total) by mouth 2 (two) times daily.  . [DISCONTINUED] metroNIDAZOLE (FLAGYL) 500 MG tablet Take 1 tablet (500 mg total) by mouth 2 (two) times daily.   No facility-administered encounter medications on file as of 11/26/2015.   No Known Allergies Patient Active Problem List   Diagnosis Date Noted  . Ureteral calculus 09/15/2012  . Personal history of colonic  polyps 04/20/2012   Social History   Social History  . Marital Status: Married    Spouse Name: N/A  . Number of Children: 2  . Years of Education: N/A   Occupational History  . mechanic/farmer    Social History Main Topics  . Smoking status: Former Smoker    Types: Cigarettes    Quit date: 09/15/2007  . Smokeless tobacco: Never Used  . Alcohol Use: No  . Drug Use: No  . Sexual Activity: Not on file   Other Topics Concern  . Not on file   Social History Narrative   Married 2 sons   Network engineer and farmer - [place]   4 caffeinated beverages qd   04/03/2015       Mr. Woosley family history includes Heart disease in his mother. There is no history of Colon cancer, Esophageal cancer, Pancreatic cancer, Stomach cancer, or Liver disease.      Objective:    Filed Vitals:   11/26/15 1340  BP: 136/84  Pulse: 72    Physical Exam wel l developed white male in no acute distress, pleasant blood pressure 136/84 pulse 72 height 5 foot 10 weight 252. HEENT; nontraumatic normocephalic EOMI PERRLA sclera anicteric, Supple no JVD, Cardiovascular; regular rate and rhythm with S1-S2 no murmur or gallop, Pulmonary; clear bilaterally, Abdomen; obese soft bowel sounds are present he is tender in the left lower quadrant and suprapubic area there is no guarding or rebound no palpable mass or  hepatosplenomegaly bowel sounds are present, Rectal ;exam not done, Extremities;o clubbing cyanosis or edema skin warm and dry, Neuropsych ;mood and affect appropriate    Assessment & Plan:   #53  55 year old white male with recurrent acute diverticulitis.  Plan; start Cipro 500 mg by mouth twice a day 14 days Start Flagyl 500 mg by mouth twice a day 14 days. He will continue Metamucil and may add MiraLAX as needed Soft bland diet until symptoms improve Offered an analgesic which she declined Patient advised  to call for any worsening in his symptoms and also asked to call if  his symptoms have not completely resolved when he finishes the antibiotics. We also began discussions regarding possible elective colon resection as he has had multiple episodes of diverticulitis.   Leemon Ayala Genia Harold PA-C 11/26/2015   Cc: Yvone Neu

## 2015-11-26 NOTE — Telephone Encounter (Signed)
Patient will come in today at 2:00

## 2018-09-09 ENCOUNTER — Emergency Department (HOSPITAL_COMMUNITY): Payer: BLUE CROSS/BLUE SHIELD

## 2018-09-09 ENCOUNTER — Other Ambulatory Visit: Payer: Self-pay

## 2018-09-09 ENCOUNTER — Encounter (HOSPITAL_COMMUNITY): Payer: Self-pay | Admitting: General Practice

## 2018-09-09 ENCOUNTER — Inpatient Hospital Stay (HOSPITAL_COMMUNITY)
Admission: EM | Admit: 2018-09-09 | Discharge: 2018-09-11 | DRG: 384 | Disposition: A | Discharge status: Home / Self Care | Payer: BLUE CROSS/BLUE SHIELD | Attending: Student in an Organized Health Care Education/Training Program | Admitting: Student in an Organized Health Care Education/Training Program

## 2018-09-09 DIAGNOSIS — K2100 Gastro-esophageal reflux disease with esophagitis, without bleeding: Secondary | ICD-10-CM

## 2018-09-09 DIAGNOSIS — R1011 Right upper quadrant pain: Secondary | ICD-10-CM | POA: Diagnosis present

## 2018-09-09 DIAGNOSIS — K315 Obstruction of duodenum: Secondary | ICD-10-CM | POA: Diagnosis present

## 2018-09-09 DIAGNOSIS — K2980 Duodenitis without bleeding: Secondary | ICD-10-CM | POA: Diagnosis present

## 2018-09-09 DIAGNOSIS — R630 Anorexia: Secondary | ICD-10-CM | POA: Diagnosis present

## 2018-09-09 DIAGNOSIS — R634 Abnormal weight loss: Secondary | ICD-10-CM | POA: Diagnosis present

## 2018-09-09 DIAGNOSIS — R112 Nausea with vomiting, unspecified: Secondary | ICD-10-CM | POA: Diagnosis not present

## 2018-09-09 DIAGNOSIS — Z86010 Personal history of colonic polyps: Secondary | ICD-10-CM

## 2018-09-09 DIAGNOSIS — K311 Adult hypertrophic pyloric stenosis: Secondary | ICD-10-CM | POA: Diagnosis present

## 2018-09-09 DIAGNOSIS — K269 Duodenal ulcer, unspecified as acute or chronic, without hemorrhage or perforation: Secondary | ICD-10-CM | POA: Diagnosis present

## 2018-09-09 DIAGNOSIS — Z8249 Family history of ischemic heart disease and other diseases of the circulatory system: Secondary | ICD-10-CM

## 2018-09-09 DIAGNOSIS — Z79899 Other long term (current) drug therapy: Secondary | ICD-10-CM

## 2018-09-09 DIAGNOSIS — Z87442 Personal history of urinary calculi: Secondary | ICD-10-CM

## 2018-09-09 DIAGNOSIS — K209 Esophagitis, unspecified: Secondary | ICD-10-CM | POA: Diagnosis not present

## 2018-09-09 DIAGNOSIS — Z9049 Acquired absence of other specified parts of digestive tract: Secondary | ICD-10-CM

## 2018-09-09 DIAGNOSIS — R1013 Epigastric pain: Secondary | ICD-10-CM | POA: Diagnosis not present

## 2018-09-09 DIAGNOSIS — K5730 Diverticulosis of large intestine without perforation or abscess without bleeding: Secondary | ICD-10-CM | POA: Diagnosis present

## 2018-09-09 DIAGNOSIS — R030 Elevated blood-pressure reading, without diagnosis of hypertension: Secondary | ICD-10-CM | POA: Diagnosis present

## 2018-09-09 DIAGNOSIS — K449 Diaphragmatic hernia without obstruction or gangrene: Secondary | ICD-10-CM | POA: Diagnosis present

## 2018-09-09 DIAGNOSIS — K2950 Unspecified chronic gastritis without bleeding: Secondary | ICD-10-CM | POA: Diagnosis present

## 2018-09-09 DIAGNOSIS — R935 Abnormal findings on diagnostic imaging of other abdominal regions, including retroperitoneum: Secondary | ICD-10-CM

## 2018-09-09 DIAGNOSIS — Z8719 Personal history of other diseases of the digestive system: Secondary | ICD-10-CM

## 2018-09-09 DIAGNOSIS — D509 Iron deficiency anemia, unspecified: Secondary | ICD-10-CM | POA: Diagnosis present

## 2018-09-09 DIAGNOSIS — K279 Peptic ulcer, site unspecified, unspecified as acute or chronic, without hemorrhage or perforation: Secondary | ICD-10-CM | POA: Diagnosis not present

## 2018-09-09 DIAGNOSIS — Z87891 Personal history of nicotine dependence: Secondary | ICD-10-CM | POA: Diagnosis not present

## 2018-09-09 HISTORY — DX: Adverse effect of unspecified anesthetic, initial encounter: T41.45XA

## 2018-09-09 HISTORY — DX: Other complications of anesthesia, initial encounter: T88.59XA

## 2018-09-09 HISTORY — DX: Other specified postprocedural states: Z98.890

## 2018-09-09 HISTORY — DX: Other specified postprocedural states: R11.2

## 2018-09-09 HISTORY — DX: Peptic ulcer, site unspecified, unspecified as acute or chronic, without hemorrhage or perforation: K27.9

## 2018-09-09 LAB — IRON AND TIBC
Iron: 36 ug/dL — ABNORMAL LOW (ref 45–182)
Saturation Ratios: 10 % — ABNORMAL LOW (ref 17.9–39.5)
TIBC: 353 ug/dL (ref 250–450)
UIBC: 317 ug/dL

## 2018-09-09 LAB — URINALYSIS, ROUTINE W REFLEX MICROSCOPIC
Bilirubin Urine: NEGATIVE
GLUCOSE, UA: NEGATIVE mg/dL
HGB URINE DIPSTICK: NEGATIVE
Ketones, ur: NEGATIVE mg/dL
Leukocytes,Ua: NEGATIVE
Nitrite: NEGATIVE
PH: 7 (ref 5.0–8.0)
Protein, ur: NEGATIVE mg/dL
Specific Gravity, Urine: 1.008 (ref 1.005–1.030)

## 2018-09-09 LAB — RETICULOCYTES
Immature Retic Fract: 25.4 % — ABNORMAL HIGH (ref 2.3–15.9)
RBC.: 4.6 MIL/uL (ref 4.22–5.81)
RETIC COUNT ABSOLUTE: 143.5 10*3/uL (ref 19.0–186.0)
Retic Ct Pct: 3.1 % (ref 0.4–3.1)

## 2018-09-09 LAB — ABO/RH: ABO/RH(D): O POS

## 2018-09-09 LAB — TYPE AND SCREEN
ABO/RH(D): O POS
Antibody Screen: NEGATIVE

## 2018-09-09 LAB — CBC
HCT: 41 % (ref 39.0–52.0)
Hemoglobin: 13.3 g/dL (ref 13.0–17.0)
MCH: 28.5 pg (ref 26.0–34.0)
MCHC: 32.4 g/dL (ref 30.0–36.0)
MCV: 87.8 fL (ref 80.0–100.0)
Platelets: 531 10*3/uL — ABNORMAL HIGH (ref 150–400)
RBC: 4.67 MIL/uL (ref 4.22–5.81)
RDW: 12.8 % (ref 11.5–15.5)
WBC: 17.1 10*3/uL — AB (ref 4.0–10.5)
nRBC: 0 % (ref 0.0–0.2)

## 2018-09-09 LAB — LACTIC ACID, PLASMA
Lactic Acid, Venous: 2.6 mmol/L (ref 0.5–1.9)
Lactic Acid, Venous: 1.2 mmol/L (ref 0.5–1.9)

## 2018-09-09 LAB — VITAMIN B12: Vitamin B-12: 445 pg/mL (ref 180–914)

## 2018-09-09 LAB — COMPREHENSIVE METABOLIC PANEL
ALBUMIN: 3.5 g/dL (ref 3.5–5.0)
ALT: 32 U/L (ref 0–44)
AST: 23 U/L (ref 15–41)
Alkaline Phosphatase: 80 U/L (ref 38–126)
Anion gap: 15 (ref 5–15)
BUN: 9 mg/dL (ref 6–20)
CO2: 19 mmol/L — AB (ref 22–32)
Calcium: 9.2 mg/dL (ref 8.9–10.3)
Chloride: 99 mmol/L (ref 98–111)
Creatinine, Ser: 1.03 mg/dL (ref 0.61–1.24)
GFR calc Af Amer: >60 mL/min (ref >60)
GFR calc non Af Amer: >60 mL/min (ref >60)
Glucose, Bld: 122 mg/dL — ABNORMAL HIGH (ref 70–99)
Potassium: 3.7 mmol/L (ref 3.5–5.1)
Sodium: 133 mmol/L — ABNORMAL LOW (ref 135–145)
Total Bilirubin: 0.7 mg/dL (ref 0.3–1.2)
Total Protein: 7.4 g/dL (ref 6.5–8.1)

## 2018-09-09 LAB — FOLATE: Folate: 19.8 ng/mL (ref >5.9)

## 2018-09-09 LAB — FERRITIN: Ferritin: 99 ng/mL (ref 24–336)

## 2018-09-09 LAB — POC OCCULT BLOOD, ED: Fecal Occult Bld: NEGATIVE

## 2018-09-09 LAB — TROPONIN I: Troponin I: <0.03 ng/mL (ref <0.03)

## 2018-09-09 LAB — LIPASE, BLOOD: Lipase: 47 U/L (ref 11–51)

## 2018-09-09 MED ORDER — ONDANSETRON HCL 4 MG PO TABS: 4.0000 mg | ORAL_TABLET | Freq: Four times a day (QID) | ORAL | Status: DC | PRN

## 2018-09-09 MED ORDER — ACETAMINOPHEN 325 MG PO TABS: 650.0000 mg | ORAL_TABLET | Freq: Four times a day (QID) | ORAL | Status: DC | PRN

## 2018-09-09 MED ORDER — DEXTROSE-NACL 5-0.45 % IV SOLN
INTRAVENOUS | Status: DC
  Administered 2018-09-09 – 2018-09-10 (×3): via INTRAVENOUS

## 2018-09-09 MED ORDER — SODIUM CHLORIDE 0.9 % IV BOLUS
1000.0000 mL | Freq: Once | INTRAVENOUS | Status: AC
  Administered 2018-09-09: 1000 mL via INTRAVENOUS

## 2018-09-09 MED ORDER — SODIUM CHLORIDE 0.9 % IV SOLN
80.0000 mg | Freq: Once | INTRAVENOUS | Status: AC
  Administered 2018-09-09: 80 mg via INTRAVENOUS
  Filled 2018-09-09: qty 80

## 2018-09-09 MED ORDER — SODIUM CHLORIDE 0.9 % IV SOLN
8.0000 mg/h | INTRAVENOUS | Status: DC
  Administered 2018-09-09 – 2018-09-10 (×3): 8 mg/h via INTRAVENOUS
  Filled 2018-09-09 (×4): qty 80

## 2018-09-09 MED ORDER — ONDANSETRON HCL 4 MG/2ML IJ SOLN: 4.0000 mg | Freq: Four times a day (QID) | INTRAMUSCULAR | Status: DC | PRN

## 2018-09-09 MED ORDER — POLYETHYLENE GLYCOL 3350 17 G PO PACK: 17.0000 g | PACK | Freq: Every day | ORAL | Status: DC | PRN

## 2018-09-09 MED ORDER — IOHEXOL 300 MG/ML  SOLN
100.0000 mL | Freq: Once | INTRAMUSCULAR | Status: AC | PRN
  Administered 2018-09-09: 100 mL via INTRAVENOUS

## 2018-09-09 MED ORDER — ACETAMINOPHEN 650 MG RE SUPP: 650.0000 mg | Freq: Four times a day (QID) | RECTAL | Status: DC | PRN

## 2018-09-09 MED ORDER — ONDANSETRON HCL 4 MG/2ML IJ SOLN
4.0000 mg | Freq: Once | INTRAMUSCULAR | Status: AC
  Administered 2018-09-09: 4 mg via INTRAVENOUS
  Filled 2018-09-09: qty 2

## 2018-09-09 MED ORDER — PANTOPRAZOLE SODIUM 40 MG IV SOLR: 40.0000 mg | Freq: Two times a day (BID) | INTRAVENOUS | Status: DC

## 2018-09-09 NOTE — ED Notes (Signed)
PA for GI at bedside for evaluation

## 2018-09-09 NOTE — ED Provider Notes (Signed)
[facility] EMERGENCY DEPARTMENT Provider Note   CSN: [id number] Arrival date & time: 09/09/18  0334    History   Chief Complaint Chief Complaint  Patient presents with  . right sided rib pain, N/V    HPI Dailey Buccheri is a 58 y.o. male.     Patient presents with nausea, vomiting, abdominal pain, weight loss and anorexia.  States has been feeling ill since February 19.  He was seen twice at an urgent care with a "chest cold" consistent with nonproductive cough and body aches.  He was treated with prednisone and antibiotics for possible pneumonia.  He finished both of these and felt better for a few days and started feeling sick again about 4 days ago.  States he is not been able to keep anything down.  He has had persistent nausea and vomiting for the past 4 days that is nonbilious and nonbloody.  No diarrhea.  No fever.  Complains of burning pain underneath his right ribs that is worse with eating.  States he was told he had dark stools when he was seen in urgent care but had a Hemoccult that was negative and started on iron.  States his stools are no longer dark.  He denies any chest pain or shortness of breath and states his cough and congestion have resolved. Reports burning pain to his right upper abdomen underneath his ribs along with nausea and vomiting and inability to eat or drink.  The history is provided by the patient.    Past Medical History:  Diagnosis Date  . Adenomatous colon polyp 2013   46mm, tubular  . Allergy    seasonal  . Broken hip ([place]) left  . Diverticulosis   . Kidney stones   . Lumbar back pain 2011   had hip fracture that caused lower back problems  . Pancreatitis     Patient Active Problem List   Diagnosis Date Noted  . Ureteral calculus 09/15/2012  . Personal history of colonic polyps 04/20/2012    Past Surgical History:  Procedure Laterality Date  . CHOLECYSTECTOMY  2008   had pancreatitis  . COLONOSCOPY W/ POLYPECTOMY     . CYSTOSCOPY WITH RETROGRADE PYELOGRAM, URETEROSCOPY AND STENT PLACEMENT Right 09/15/2012   Procedure: CYSTOSCOPY WITH right  RETROGRADE PYELOGRAM, right  URETEROSCOPY AND STENT PLACEMENT;  Surgeon: Bernestine Amass, MD;  Location: [facility];  Service: Urology;  Laterality: Right;  . HOLMIUM LASER APPLICATION Right 7/82/9562   Procedure: HOLMIUM LASER APPLICATION;  Surgeon: Bernestine Amass, MD;  Location: [facility];  Service: Urology;  Laterality: Right;  . KNEE ARTHROSCOPY Right 1993   right        Home Medications    Prior to Admission medications   Medication Sig Start Date End Date Taking? Authorizing Provider  psyllium (METAMUCIL) 58.6 % powder Take 1 packet by mouth daily.    [provider]    Family History Family History  Problem Relation Age of Onset  . Heart disease Mother   . Colon cancer Neg Hx   . Esophageal cancer Neg Hx   . Pancreatic cancer Neg Hx   . Stomach cancer Neg Hx   . Liver disease Neg Hx     Social History Social History   Tobacco Use  . Smoking status: Former Smoker    Types: Cigarettes    Last attempt to quit: 09/15/2007    Years since quitting: 10.9  . Smokeless tobacco: Never Used  Substance Use Topics  . Alcohol use: No    Alcohol/week: 0.0 standard drinks  . Drug use: No     Allergies   Patient has no known allergies.   Review of Systems Review of Systems  Constitutional: Positive for activity change, appetite change and fatigue. Negative for fever.  HENT: Negative for congestion and rhinorrhea.   Respiratory: Positive for cough and shortness of breath. Negative for chest tightness.   Cardiovascular: Negative for chest pain.  Gastrointestinal: Positive for abdominal pain, nausea and vomiting.  Genitourinary: Negative for dysuria.  Musculoskeletal: Positive for arthralgias and myalgias.  Skin: Negative for rash.  Neurological: Negative for dizziness, weakness and headaches.    all  other systems are negative except as noted in the HPI and PMH.    Physical Exam Updated Vital Signs BP (!) 152/101 (BP Location: Right Arm)   Pulse 89   Temp 98.3 F (36.8 C) (Oral)   Resp 18   Ht 6\' 1"  (1.854 m)   Wt 110.2 kg   SpO2 99%   BMI 32.06 kg/m   Physical Exam Vitals signs and nursing note reviewed.  Constitutional:      General: He is not in acute distress.    Appearance: He is well-developed.     Comments: Vomiting on assessment Pale-appearing  HENT:     Head: Normocephalic and atraumatic.     Mouth/Throat:     Pharynx: No oropharyngeal exudate.  Eyes:     Conjunctiva/sclera: Conjunctivae normal.     Pupils: Pupils are equal, round, and reactive to light.  Neck:     Musculoskeletal: Normal range of motion and neck supple.     Comments: No meningismus. Cardiovascular:     Rate and Rhythm: Normal rate and regular rhythm.     Heart sounds: Normal heart sounds. No murmur.  Pulmonary:     Effort: Pulmonary effort is normal. No respiratory distress.     Breath sounds: Normal breath sounds.  Abdominal:     Palpations: Abdomen is soft.     Tenderness: There is abdominal tenderness. There is no guarding or rebound.     Comments: Soft abdomen, mild right upper quadrant tenderness, no guarding or rebound  Genitourinary:    Comments: Chaperone present, no external hemorrhoids, no gross blood Musculoskeletal: Normal range of motion.        General: No tenderness.  Skin:    General: Skin is warm.     Capillary Refill: Capillary refill takes less than 2 seconds.  Neurological:     General: No focal deficit present.     Mental Status: He is alert and oriented to person, place, and time. Mental status is at baseline.     Cranial Nerves: No cranial nerve deficit.     Motor: No abnormal muscle tone.     Coordination: Coordination normal.     Comments: No ataxia on finger to nose bilaterally. No pronator drift. 5/5 strength throughout. CN 2-12 intact.Equal grip  strength. Sensation intact.   Psychiatric:        Behavior: Behavior normal.      ED Treatments / Results  Labs (all labs ordered are listed, but only abnormal results are displayed) Labs Reviewed  CBC - Abnormal; Notable for the following components:      Result Value   WBC 17.1 (*)    Platelets 531 (*)    All other components within normal limits  COMPREHENSIVE METABOLIC PANEL - Abnormal; Notable for the following components:   Sodium  133 (*)    CO2 19 (*)    Glucose, Bld 122 (*)    All other components within normal limits  LACTIC ACID, PLASMA - Abnormal; Notable for the following components:   Lactic Acid, Venous 2.6 (*)    All other components within normal limits  IRON AND TIBC - Abnormal; Notable for the following components:   Iron 36 (*)    Saturation Ratios 10 (*)    All other components within normal limits  RETICULOCYTES - Abnormal; Notable for the following components:   Immature Retic Fract 25.4 (*)    All other components within normal limits  CULTURE, BLOOD (ROUTINE X 2)  CULTURE, BLOOD (ROUTINE X 2)  TROPONIN I  VITAMIN B12  FOLATE  FERRITIN  LIPASE, BLOOD  URINALYSIS, ROUTINE W REFLEX MICROSCOPIC  LACTIC ACID, PLASMA  POC OCCULT BLOOD, ED  TYPE AND SCREEN    EKG EKG Interpretation  Date/Time:  Thursday September 09 2018 03:42:30 EST Ventricular Rate:  104 PR Interval:  128 QRS Duration: 70 QT Interval:  356 QTC Calculation: 468 R Axis:   48 Text Interpretation:  Sinus tachycardia Otherwise normal ECG No previous ECGs available Confirmed by Ezequiel Essex [phone]) on 09/09/2018 4:13:00 AM   Radiology Dg Chest 2 View  Result Date: 09/09/2018 CLINICAL DATA:  Right rib pain.  20 pound weight loss for 2 weeks. EXAM: CHEST - 2 VIEW COMPARISON:  None. FINDINGS: The heart size and mediastinal contours are within normal limits. Both lungs are clear. The visualized skeletal structures are unremarkable. Cholecystectomy clips. IMPRESSION: Negative chest.  Electronically Signed   By: Monte Fantasia M.D.   On: 09/09/2018 05:40   Ct Abdomen Pelvis W Contrast  Result Date: 09/09/2018 CLINICAL DATA:  58 year old male with history of mid to upper abdominal pain and right-sided rib pain with nausea and vomiting for the past 6 days. EXAM: CT ABDOMEN AND PELVIS WITH CONTRAST TECHNIQUE: Multidetector CT imaging of the abdomen and pelvis was performed using the standard protocol following bolus administration of intravenous contrast. CONTRAST:  15mL OMNIPAQUE IOHEXOL 300 MG/ML  SOLN COMPARISON:  No priors. FINDINGS: Lower chest: Unremarkable. Hepatobiliary: No suspicious cystic or solid hepatic lesions. No intra or extrahepatic biliary ductal dilatation. Status post cholecystectomy. Pancreas: No pancreatic mass. No pancreatic ductal dilatation. No pancreatic or peripancreatic fluid or inflammatory changes. Spleen: Unremarkable. Adrenals/Urinary Tract: 3.8 cm low-attenuation lesion in the interpolar region of the left kidney is compatible with a simple cyst. Right kidney and bilateral adrenal glands are normal in appearance. No hydroureteronephrosis. Urinary bladder is normal in appearance. Stomach/Bowel: Normal appearance of the stomach. Severe mural thickening in the duodenum, particularly in the region of the duodenal bulb and second portion of the duodenum. On axial image 33 of series 3 and coronal image 81 of series 6 there appears to be a small focal outpouching from the posterior aspect of the duodenal bulb and proximal second portion of the duodenum, highly concerning for ulcer. Subtle surrounding inflammatory changes in the adjacent retroperitoneal fat. No pathologic dilatation of distal portions of small bowel or colon. A few colonic diverticulae are noted, particularly in the sigmoid colon, without surrounding inflammatory changes to suggest an acute diverticulitis at this time. Normal appendix. Vascular/Lymphatic: Aortic atherosclerosis, without evidence of  aneurysm or dissection in the abdominal or pelvic vasculature. No lymphadenopathy noted in the abdomen or pelvis. Reproductive: Prostate gland and seminal vesicles are unremarkable in appearance. Other: No significant volume of ascites.  No pneumoperitoneum. Musculoskeletal: There are no  aggressive appearing lytic or blastic lesions noted in the visualized portions of the skeleton. IMPRESSION: 1. Findings are highly concerning for duodenitis with probable duodenal ulcer involving the posterior aspect of the duodenal bulb and proximal second portion of the duodenum. Further evaluation by Gastroenterology is strongly recommended. 2. Colonic diverticulosis without evidence of acute diverticulitis at this time. 3. Aortic atherosclerosis. Electronically Signed   By: Vinnie Langton M.D.   On: 09/09/2018 07:29   US Abdomen Limited Ruq  Result Date: 09/09/2018 CLINICAL DATA:  58 year old male with right upper quadrant abdominal pain since February 19th. Prior cholecystectomy. EXAM: ULTRASOUND ABDOMEN LIMITED RIGHT UPPER QUADRANT COMPARISON:  CT Abdomen and Pelvis earlier today. FINDINGS: Gallbladder: Surgically absent. Common bile duct: Diameter: 6 millimeters, upper limits of normal. Liver: No intrahepatic biliary ductal dilatation. Echogenic liver (image 25). No discrete liver lesion. Portal vein is patent on color Doppler imaging with normal direction of blood flow towards the liver. Other findings: Negative visible right kidney. No free fluid. The duodenum which appeared abnormal on the earlier CT is not evaluated. IMPRESSION: 1. No evidence of biliary obstruction. Surgically absent gallbladder. 2. Hepatic steatosis. 3. The duodenum which appeared abnormal on the earlier CT is not evaluated by ultrasound. Electronically Signed   By: Genevie Ann M.D.   On: 09/09/2018 07:57    Procedures Procedures (including critical care time)  Medications Ordered in ED Medications  sodium chloride 0.9 % bolus 1,000 mL (has no  administration in time range)  ondansetron (ZOFRAN) injection 4 mg (has no administration in time range)     Initial Impression / Assessment and Plan / ED Course  I have reviewed the triage vital signs and the nursing notes.  Pertinent labs & imaging results that were available during my care of the patient were reviewed by me and considered in my medical decision making (see chart for details).       4 days of burning upper abdominal pain with nausea and vomiting, weight loss and fatigue.  Appears dry on exam. Lactate elevated, WBC elevated but has been on steroids. IVF given. CXR negative.   contrary to triage note, has more abdominal pain than chest pain.  LFTs and lipase normal.   CT shows duodenititis with possible ulcer. Hemoglobin stable and FOBT negative.  Start IV protonix.  Remains comfortable on recheck. Declines further pain meds.   Continue IVF and IV PPI. Nausea and vomiting persist. Admission d/w internal medicine residents.  Call to [facility] pending.  CRITICAL CARE Performed by: Ezequiel Essex Total critical care time: 35 minutes Critical care time was exclusive of separately billable procedures and treating other patients. Critical care was necessary to treat or prevent imminent or life-threatening deterioration. Critical care was time spent personally by me on the following activities: development of treatment plan with patient and/or surrogate as well as nursing, discussions with consultants, evaluation of patient's response to treatment, examination of patient, obtaining history from patient or surrogate, ordering and performing treatments and interventions, ordering and review of laboratory studies, ordering and review of radiographic studies, pulse oximetry and re-evaluation of patient's condition.   Final Clinical Impressions(s) / ED Diagnoses   Final diagnoses:  RUQ pain  Duodenitis  Intractable vomiting with nausea, unspecified vomiting type    ED  Discharge Orders    None       Farooq Petrovich, Annie Main, MD 09/09/18 [phone]

## 2018-09-09 NOTE — Anesthesia Preprocedure Evaluation (Addendum)
Anesthesia Evaluation  Patient identified by MRN, date of birth, ID band Patient awake    Reviewed: Allergy & Precautions, NPO status , Patient's Chart, lab work & pertinent test results  History of Anesthesia Complications (+) PONV  Airway Mallampati: II  TM Distance: >3 FB Neck ROM: Full    Dental no notable dental hx. (+) Teeth Intact, Dental Advisory Given   Pulmonary former smoker,    Pulmonary exam normal breath sounds clear to auscultation       Cardiovascular Exercise Tolerance: Good Normal cardiovascular exam Rhythm:Regular Rate:Normal     Neuro/Psych negative neurological ROS  negative psych ROS   GI/Hepatic Neg liver ROS, PUD,   Endo/Other    Renal/GU Renal disease     Musculoskeletal   Abdominal (+) + obese,   Peds  Hematology negative hematology ROS (+)   Anesthesia Other Findings   Reproductive/Obstetrics                           Anesthesia Physical Anesthesia Plan  ASA: II  Anesthesia Plan: MAC   Post-op Pain Management:    Induction: Intravenous  PONV Risk Score and Plan: Treatment may vary due to age or medical condition  Airway Management Planned: Natural Airway and Nasal Cannula  Additional Equipment:   Intra-op Plan:   Post-operative Plan:   Informed Consent: I have reviewed the patients History and Physical, chart, labs and discussed the procedure including the risks, benefits and alternatives for the proposed anesthesia with the patient or authorized representative who has indicated his/her understanding and acceptance.     Dental advisory given  Plan Discussed with:   Anesthesia Plan Comments: (EGD)        Anesthesia Quick Evaluation

## 2018-09-09 NOTE — ED Notes (Signed)
Admitting MD at bedside.

## 2018-09-09 NOTE — H&P (View-Only) (Signed)
[facility] Consult: 10:14 AM 09/09/2018  LOS: 0 days    Referring Provider: Dr Evette Doffing  Primary Care Physician:  Yvone Neu, MD Primary Gastroenterologist:  Dr. Carlean Purl     Reason for Consultation:  N/V, weight loss   HPI: Caleb Mason is a 58 y.o. male.  PMH kidney stones.  S/p cystoscopy, ureteral stent.  Biliary pancreatitis.  S/P cholecystectomy 2008.   04/2012 Colonoscopy.  Average risk screening study.  Sigmoid diverticulosis.  6 mm, sessile polyp removed (tubular adenoma without high-grade dysplasia). 05/2015 Colonoscopy: For change in bowel habits, polyp surveillance.  Sigmoid diverticulosis.  No recurrent polyps. No previous EGD  2 weeks of predominantly postprandial nausea vomiting.  No hematemesis, coffee ground or coffee like emesis.  Pain in the right upper quadrant/epigastrium with p.o. intake, resolved after emesis.  More recently having heartburn when he lays flat.  Took Alka-seltzer antacid but not using PPI, H2 blocker.   Seen by local MD 08/25/2018.  Concerned re respiratory status.  He was given 5 days of prednisone, some IV fluids. Second visit to local MD 08/30/2018.  He was anemic but FOBT negative.  Chest x-ray showed what look like pneumonia,  Given IM abx, Zithromax and OTC iron. Between the 2 visits he had 3 or 4 days of dark stools.  Consistency of stools was soft which is his normal.  After 4 days, stools resumed brown color. They were darker once he started iron but stopped iron several days ago and stools now brown.   Because of persistent inability to keep down p.o., 20 # weight loss and feeling awful, he came to [facility] ED for evaluation.  WBC 17.1.  Lactate 2.6 >> 1.2 with hydration.  Hgb 13.3. Na 133.  Renal fx ok.  Glucose 122.  LFTs, lipase normal. U/A unremarkable.    Troponin I normal. Stool FOBT negative.   Blood cultures pending Iron, iron sats low, ferritin 99.  Folate and B12 ok.    CT Ab/pelvis w contrast:   Severe mural thickening particularly at the duodenal bulb and proximal second duodenum.  Focal outpouching at the posterior duodenal bulb/D2 concerning for ulcer.  Uncomplicated colonic diverticulosis. Abdominal ultrasound: [place] steatosis.  Surgically absent GB.  Normal, nondilated biliary tree.  Duodenum not evaluated on this ultrasound study.  Patient started on Protonix drip.  NPO.   At home he does not take aspirin powders, aspirin products or NSAIDs.  Fm Hx negative for ulcers, colon cancer, colon polyps.       Past Medical History:  Diagnosis Date  . Adenomatous colon polyp 2013   67mm, tubular  . Allergy    seasonal  . Broken hip ([place]) left  . Diverticulosis   . Kidney stones   . Lumbar back pain 2011   had hip fracture that caused lower back problems  . Pancreatitis     Past Surgical History:  Procedure Laterality Date  . CHOLECYSTECTOMY  2008   had pancreatitis  . COLONOSCOPY W/ POLYPECTOMY    . CYSTOSCOPY WITH RETROGRADE PYELOGRAM, URETEROSCOPY AND STENT PLACEMENT  Right 09/15/2012   Procedure: CYSTOSCOPY WITH right  RETROGRADE PYELOGRAM, right  URETEROSCOPY AND STENT PLACEMENT;  Surgeon: Bernestine Amass, MD;  Location: [facility];  Service: Urology;  Laterality: Right;  . HOLMIUM LASER APPLICATION Right 1/75/1025   Procedure: HOLMIUM LASER APPLICATION;  Surgeon: Bernestine Amass, MD;  Location: [facility];  Service: Urology;  Laterality: Right;  . KNEE ARTHROSCOPY Right 1993   right    Prior to Admission medications   Not on File    Scheduled Meds: . [START ON 09/12/2018] pantoprazole  40 mg Intravenous Q12H   Infusions: . pantoprozole (PROTONIX) infusion 8 mg/hr (09/09/18 0837)   PRN Meds: acetaminophen **OR** acetaminophen, ondansetron **OR** ondansetron (ZOFRAN) IV,  polyethylene glycol   Allergies as of 09/09/2018  . (No Known Allergies)    Family History  Problem Relation Age of Onset  . Heart disease Mother   . Colon cancer Neg Hx   . Esophageal cancer Neg Hx   . Pancreatic cancer Neg Hx   . Stomach cancer Neg Hx   . Liver disease Neg Hx     Social History   Socioeconomic History  . Marital status: Married    Spouse name: Not on file  . Number of children: 2  . Years of education: Not on file  . Highest education level: Not on file  Occupational History  . Occupation: Probation officer  Social Needs  . Financial resource strain: Not on file  . Food insecurity:    Worry: Not on file    Inability: Not on file  . Transportation needs:    Medical: Not on file    Non-medical: Not on file  Tobacco Use  . Smoking status: Former Smoker    Types: Cigarettes    Last attempt to quit: 09/15/2007    Years since quitting: 10.9  . Smokeless tobacco: Never Used  Substance and Sexual Activity  . Alcohol use: No    Alcohol/week: 0.0 standard drinks  . Drug use: No  . Sexual activity: Not on file  Lifestyle  . Physical activity:    Days per week: Not on file    Minutes per session: Not on file  . Stress: Not on file  Relationships  . Social connections:    Talks on phone: Not on file    Gets together: Not on file    Attends religious service: Not on file    Active member of club or organization: Not on file    Attends meetings of clubs or organizations: Not on file    Relationship status: Not on file  . Intimate partner violence:    Fear of current or ex partner: Not on file    Emotionally abused: Not on file    Physically abused: Not on file    Forced sexual activity: Not on file  Other Topics Concern  . Not on file  Social History Narrative   Married 2 sons   Network engineer and farmer - [place]   4 caffeinated beverages qd   04/03/2015    REVIEW OF SYSTEMS: Constitutional: Tired, weak ENT:  No nose  bleeds Pulm: No current shortness of breath or cough. CV:  No palpitations, no LE edema.  No chest pain GU:  No hematuria, no frequency GI: Per HPI Heme: No excessive or unusual bleeding or bruising. Transfusions: None Neuro:  No headaches, no peripheral tingling or numbness.  No syncope.  No seizures. Derm:  No itching, no rash or  sores.  Endocrine:  No sweats or chills.  No polyuria or dysuria Immunization: Did not query him on recent vaccinations. Travel:  None beyond local counties in last few months.    PHYSICAL EXAM: Vital signs in last 24 hours: Vitals:   09/09/18 0915 09/09/18 0930  BP: (!) 179/99 139/78  Pulse:    Resp: 11 (!) 9  Temp:    SpO2: 98% 97%   Wt Readings from Last 3 Encounters:  09/09/18 110.2 kg  11/26/15 114.7 kg  05/09/15 114.3 kg    General: Looks a little tired and unwell but not acutely ill-appearing Head: No facial asymmetry or swelling.  No signs of head trauma. Eyes: No conjunctival pallor.  No scleral icterus.  EOMI. Ears: Not hard of hearing Nose: No congestion or discharge. Mouth: Tongue midline.  Oral mucosa moist, pink, clear. Neck: No JVD, no masses, no thyromegaly. Lungs: No labored breathing or cough.  Lungs clear bilaterally. Heart: RRR.  No MRG.  S1, S2 present Abdomen: Soft.  Not tender or distended.  No HSM, masses, bruits, hernias..   Rectal: Deferred Musc/Skeltl: No obvious joint contracture deformities or swelling. Extremities: No CCE. Neurologic: Alert and oriented x3.  Moves all 4 limbs without tremor.  Full strength.  No gross deficits. Skin: No rashes, no sores, no telangiectasia. Nodes: No cervical adenopathy Psych: Cooperative, pleasant, fluid speech.  Calm.  Intake/Output from previous day: No intake/output data recorded. Intake/Output this shift: Total I/O In: 1000 [IV Piggyback:1000] Out: -   LAB RESULTS: Recent Labs    09/09/18 0450  WBC 17.1*  HGB 13.3  HCT 41.0  PLT 531*   BMET Lab Results   Component Value Date   NA 133 (L) 09/09/2018   K 3.7 09/09/2018   CL 99 09/09/2018   CO2 19 (L) 09/09/2018   GLUCOSE 122 (H) 09/09/2018   BUN 9 09/09/2018   CREATININE 1.03 09/09/2018   CALCIUM 9.2 09/09/2018   LFT Recent Labs    09/09/18 0450  PROT 7.4  ALBUMIN 3.5  AST 23  ALT 32  ALKPHOS 80  BILITOT 0.7   PT/INR No results found for: INR, PROTIME Hepatitis Panel No results for input(s): HEPBSAG, HCVAB, HEPAIGM, HEPBIGM in the last 72 hours. C-Diff No components found for: CDIFF Lipase     Component Value Date/Time   LIPASE 47 09/09/2018 0450    Drugs of Abuse  No results found for: LABOPIA, COCAINSCRNUR, LABBENZ, AMPHETMU, THCU, LABBARB   RADIOLOGY STUDIES: Dg Chest 2 View  Result Date: 09/09/2018 CLINICAL DATA:  Right rib pain.  20 pound weight loss for 2 weeks. EXAM: CHEST - 2 VIEW COMPARISON:  None. FINDINGS: The heart size and mediastinal contours are within normal limits. Both lungs are clear. The visualized skeletal structures are unremarkable. Cholecystectomy clips. IMPRESSION: Negative chest. Electronically Signed   By: Monte Fantasia M.D.   On: 09/09/2018 05:40   Ct Abdomen Pelvis W Contrast  Result Date: 09/09/2018 CLINICAL DATA:  58 year old male with history of mid to upper abdominal pain and right-sided rib pain with nausea and vomiting for the past 6 days. EXAM: CT ABDOMEN AND PELVIS WITH CONTRAST TECHNIQUE: Multidetector CT imaging of the abdomen and pelvis was performed using the standard protocol following bolus administration of intravenous contrast. CONTRAST:  160mL OMNIPAQUE IOHEXOL 300 MG/ML  SOLN COMPARISON:  No priors. FINDINGS: Lower chest: Unremarkable. Hepatobiliary: No suspicious cystic or solid hepatic lesions. No intra or extrahepatic biliary ductal dilatation. Status post cholecystectomy. Pancreas: No pancreatic mass.  No pancreatic ductal dilatation. No pancreatic or peripancreatic fluid or inflammatory changes. Spleen: Unremarkable.  Adrenals/Urinary Tract: 3.8 cm low-attenuation lesion in the interpolar region of the left kidney is compatible with a simple cyst. Right kidney and bilateral adrenal glands are normal in appearance. No hydroureteronephrosis. Urinary bladder is normal in appearance. Stomach/Bowel: Normal appearance of the stomach. Severe mural thickening in the duodenum, particularly in the region of the duodenal bulb and second portion of the duodenum. On axial image 33 of series 3 and coronal image 81 of series 6 there appears to be a small focal outpouching from the posterior aspect of the duodenal bulb and proximal second portion of the duodenum, highly concerning for ulcer. Subtle surrounding inflammatory changes in the adjacent retroperitoneal fat. No pathologic dilatation of distal portions of small bowel or colon. A few colonic diverticulae are noted, particularly in the sigmoid colon, without surrounding inflammatory changes to suggest an acute diverticulitis at this time. Normal appendix. Vascular/Lymphatic: Aortic atherosclerosis, without evidence of aneurysm or dissection in the abdominal or pelvic vasculature. No lymphadenopathy noted in the abdomen or pelvis. Reproductive: Prostate gland and seminal vesicles are unremarkable in appearance. Other: No significant volume of ascites.  No pneumoperitoneum. Musculoskeletal: There are no aggressive appearing lytic or blastic lesions noted in the visualized portions of the skeleton. IMPRESSION: 1. Findings are highly concerning for duodenitis with probable duodenal ulcer involving the posterior aspect of the duodenal bulb and proximal second portion of the duodenum. Further evaluation by Gastroenterology is strongly recommended. 2. Colonic diverticulosis without evidence of acute diverticulitis at this time. 3. Aortic atherosclerosis. Electronically Signed   By: Vinnie Langton M.D.   On: 09/09/2018 07:29   US Abdomen Limited Ruq  Result Date: 09/09/2018 CLINICAL DATA:   58 year old male with right upper quadrant abdominal pain since February 19th. Prior cholecystectomy. EXAM: ULTRASOUND ABDOMEN LIMITED RIGHT UPPER QUADRANT COMPARISON:  CT Abdomen and Pelvis earlier today. FINDINGS: Gallbladder: Surgically absent. Common bile duct: Diameter: 6 millimeters, upper limits of normal. Liver: No intrahepatic biliary ductal dilatation. Echogenic liver (image 25). No discrete liver lesion. Portal vein is patent on color Doppler imaging with normal direction of blood flow towards the liver. Other findings: Negative visible right kidney. No free fluid. The duodenum which appeared abnormal on the earlier CT is not evaluated. IMPRESSION: 1. No evidence of biliary obstruction. Surgically absent gallbladder. 2. Hepatic steatosis. 3. The duodenum which appeared abnormal on the earlier CT is not evaluated by ultrasound. Electronically Signed   By: Genevie Ann M.D.   On: 09/09/2018 07:57     IMPRESSION:   *   Post prandial N/V with  20 #weight loss/2 weeks. CT showing likely duodenal ulcer.   Though no obstruction on CT, certainly sxs s/o GOO.   Now on Protonix gtt and NPO.      PLAN:     *   Continue Protonix gtt, IVF, NPO.  No EGD yet, as pt at risk of perforation.  Case D/W Dr Henrene Pastor.  Plan is for EGD tmrw at 11:30 AM.      *   Serum H Pylori Ab ordered.     *   Added IVF of d5/1/2 NS at 125 hour.  He received 2 liters IVF but needs maintenance IVF til able to take PO.     Azucena Freed  09/09/2018, 10:14 AM Phone [phone]  GI ATTENDING  History, laboratories, x-rays reviewed.  Patient personally seen and examined.  Agree with comprehensive consultation note as  outlined above.  Patient presents after persistent respiratory illness with complaints of postprandial abdominal discomfort with nausea and vomiting.  Laboratories unremarkable.  Stool Hemoccult negative.  Ultrasound unrevealing.  CT scan shows changes suggesting duodenal ulcer disease.  Agree with plans for IV  hydration, nothing by mouth except sips or chips, and IV PPI.  As well antiemetics as needed.  Plans for diagnostic upper endoscopy tomorrow morning at approximately 11:30 AM.The nature of the procedure, as well as the risks, benefits, and alternatives were carefully and thoroughly reviewed with the patient. Ample time for discussion and questions allowed. The patient understood, was satisfied, and agreed to proceed.  Docia Chuck. Geri Seminole., M.D. [facility] Division of Gastroenterology

## 2018-09-09 NOTE — ED Notes (Signed)
ED TO INPATIENT HANDOFF REPORT  ED Nurse Name and Phone #: [phone]  S Name/Age/Gender Caleb Mason 58 y.o. male Room/Bed: 054C/054C  Code Status   Code Status: Full Code  Home/SNF/Other Home Patient oriented to: x4 Is this baseline? y  Triage Complete: Triage complete  Chief Complaint cp  Triage Note Pt c/o right sided chest/rib pain 2/10 in severity. Pt states that he has lost 20 pounds in two weeks. States that he has been feeling ill since feb 19th. Was diagnosed with PNA and given prednisone. Pt states that symptoms have not resolved and that he has been unable to keep anything down and has been vomiting since Sat.   Allergies No Known Allergies  Level of Care/Admitting Diagnosis ED Disposition    ED Disposition Condition Comment   Admit  Hospital Area: [facility] [[id number]]  Level of Care: Medical Telemetry [104]  Diagnosis: PUD (peptic ulcer disease) [841660]  Admitting Physician: Axel Filler [phone]  Attending Physician: Axel Filler [phone]  PT Class (Do Not Modify): Observation [104]  PT Acc Code (Do Not Modify): Observation [10022]       B Medical/Surgery History Past Medical History:  Diagnosis Date  . Adenomatous colon polyp 2013   38mm, tubular  . Allergy    seasonal  . Broken hip ([place]) left  . Diverticulosis   . Kidney stones   . Lumbar back pain 2011   had hip fracture that caused lower back problems  . Pancreatitis    Past Surgical History:  Procedure Laterality Date  . CHOLECYSTECTOMY  2008   had pancreatitis  . COLONOSCOPY W/ POLYPECTOMY    . CYSTOSCOPY WITH RETROGRADE PYELOGRAM, URETEROSCOPY AND STENT PLACEMENT Right 09/15/2012   Procedure: CYSTOSCOPY WITH right  RETROGRADE PYELOGRAM, right  URETEROSCOPY AND STENT PLACEMENT;  Surgeon: Bernestine Amass, MD;  Location: [facility];  Service: Urology;  Laterality: Right;  . HOLMIUM LASER APPLICATION Right 7/32/2025   Procedure:  HOLMIUM LASER APPLICATION;  Surgeon: Bernestine Amass, MD;  Location: [facility];  Service: Urology;  Laterality: Right;  . KNEE ARTHROSCOPY Right 1993   right     A IV Location/Drains/Wounds Patient Lines/Drains/Airways Status   Active Line/Drains/Airways    Name:   Placement date:   Placement time:   Site:   Days:   Peripheral IV 09/09/18 Left Forearm   09/09/18    0501    Forearm   less than 1   Peripheral IV 09/09/18 Right Forearm   09/09/18    0501    Forearm   less than 1   Ureteral Drain/Stent Right ureter 6 Fr.   09/15/12    1221    Right ureter   2185   Incision 09/15/12 Perineum Right   09/15/12    1218     2185          Intake/Output Last 24 hours  Intake/Output Summary (Last 24 hours) at 09/09/2018 1125 Last data filed at 09/09/2018 1106 Gross per 24 hour  Intake 2000 ml  Output -  Net 2000 ml    Labs/Imaging Results for orders placed or performed during the hospital encounter of 09/09/18 (from the past 48 hour(s))  CBC     Status: Abnormal   Collection Time: 09/09/18  4:50 AM  Result Value Ref Range   WBC 17.1 (H) 4.0 - 10.5 K/uL   RBC 4.67 4.22 - 5.81 MIL/uL   Hemoglobin 13.3 13.0 - 17.0 g/dL  HCT 41.0 39.0 - 52.0 %   MCV 87.8 80.0 - 100.0 fL   MCH 28.5 26.0 - 34.0 pg   MCHC 32.4 30.0 - 36.0 g/dL   RDW 12.8 11.5 - 15.5 %   Platelets 531 (H) 150 - 400 K/uL   nRBC 0.0 0.0 - 0.2 %    Comment: Performed at [facility] [address], [place], [place] [address]  Comprehensive metabolic panel     Status: Abnormal   Collection Time: 09/09/18  4:50 AM  Result Value Ref Range   Sodium 133 (L) 135 - 145 mmol/L   Potassium 3.7 3.5 - 5.1 mmol/L   Chloride 99 98 - 111 mmol/L   CO2 19 (L) 22 - 32 mmol/L   Glucose, Bld 122 (H) 70 - 99 mg/dL   BUN 9 6 - 20 mg/dL   Creatinine, Ser 1.03 0.61 - 1.24 mg/dL   Calcium 9.2 8.9 - 10.3 mg/dL   Total Protein 7.4 6.5 - 8.1 g/dL   Albumin 3.5 3.5 - 5.0 g/dL   AST 23 15 - 41 U/L   ALT 32 0 - 44 U/L    Alkaline Phosphatase 80 38 - 126 U/L   Total Bilirubin 0.7 0.3 - 1.2 mg/dL   GFR calc non Af Amer >60 >60 mL/min   GFR calc Af Amer >60 >60 mL/min   Anion gap 15 5 - 15    Comment: Performed at [facility], [place] [address] [place], [place] [address]  Lactic acid, plasma     Status: Abnormal   Collection Time: 09/09/18  4:50 AM  Result Value Ref Range   Lactic Acid, Venous 2.6 (HH) 0.5 - 1.9 mmol/L    Comment: CRITICAL RESULT CALLED TO, READ BACK BY AND VERIFIED WITH: Filbert Berthold RN 09/09/2018 4010 JORDANS Performed at [facility], [place] [address], [place], [place] [address]   Troponin I - ONCE - STAT     Status: None   Collection Time: 09/09/18  4:50 AM  Result Value Ref Range   Troponin I <0.03 <0.03 ng/mL    Comment: Performed at [facility], [place] [address], [place], [place] [address]  Vitamin B12     Status: None   Collection Time: 09/09/18  4:50 AM  Result Value Ref Range   Vitamin B-12 445 180 - 914 pg/mL    Comment: (NOTE) This assay is not validated for testing neonatal or myeloproliferative syndrome specimens for Vitamin B12 levels. Performed at [facility], [place] [address], [place], [place] [address]   Folate     Status: None   Collection Time: 09/09/18  4:50 AM  Result Value Ref Range   Folate 19.8 >5.9 ng/mL    Comment: Performed at [facility], [place] [address] [place], [place] [address]  Iron and TIBC     Status: Abnormal   Collection Time: 09/09/18  4:50 AM  Result Value Ref Range   Iron 36 (L) 45 - 182 ug/dL   TIBC 353 250 - 450 ug/dL   Saturation Ratios 10 (L) 17.9 - 39.5 %   UIBC 317 ug/dL    Comment: Performed at [facility], [place] [address], [place],  [address]  Ferritin     Status: None   Collection Time: 09/09/18  4:50 AM  Result Value Ref Range   Ferritin 99 24 - 336 ng/mL    Comment: Performed at [facility], [address] [place], [place] [address]  Reticulocytes     Status: Abnormal    Collection Time: 09/09/18  4:50 AM  Result Value Ref Range   Retic Ct Pct 3.1 0.4 - 3.1 %   RBC. 4.60 4.22 - 5.81 MIL/uL   Retic Count, Absolute 143.5 19.0 - 186.0 K/uL   Immature Retic Fract 25.4 (H) 2.3 - 15.9 %    Comment: Performed at [facility] [address] [place], [place] [address]  Lipase, blood     Status: None   Collection Time: 09/09/18  4:50 AM  Result Value Ref Range   Lipase 47 11 - 51 U/L    Comment: Performed at [facility] [address] [place], [place] [address]  POC occult blood, ED     Status: None   Collection Time: 09/09/18  5:35 AM  Result Value Ref Range   Fecal Occult Bld NEGATIVE NEGATIVE  Urinalysis, Routine w reflex microscopic     Status: None   Collection Time: 09/09/18  7:24 AM  Result Value Ref Range   Color, Urine YELLOW YELLOW   APPearance CLEAR CLEAR   Specific Gravity, Urine 1.008 1.005 - 1.030   pH 7.0 5.0 - 8.0   Glucose, UA NEGATIVE NEGATIVE mg/dL   Hgb urine dipstick NEGATIVE NEGATIVE   Bilirubin Urine NEGATIVE NEGATIVE   Ketones, ur NEGATIVE NEGATIVE mg/dL   Protein, ur NEGATIVE NEGATIVE mg/dL   Nitrite NEGATIVE NEGATIVE   Leukocytes,Ua NEGATIVE NEGATIVE    Comment: Performed at [facility] [address], [place], [place] [address]  Lactic acid, plasma     Status: None   Collection Time: 09/09/18  8:10 AM  Result Value Ref Range   Lactic Acid, Venous 1.2 0.5 - 1.9 mmol/L    Comment: Performed at [facility] [address] [place], [place] [address]  Type and screen [place]     Status: None   Collection Time: 09/09/18  9:29 AM  Result Value Ref Range   ABO/RH(D) O POS    Antibody Screen NEG    Sample Expiration      09/12/2018 Performed at [facility], [place] [address], [place], [place] [address]    Dg Chest 2 View  Result Date: 09/09/2018 CLINICAL DATA:  Right rib pain.  20 pound weight loss for 2 weeks. EXAM: CHEST - 2 VIEW COMPARISON:  None. FINDINGS: The heart  size and mediastinal contours are within normal limits. Both lungs are clear. The visualized skeletal structures are unremarkable. Cholecystectomy clips. IMPRESSION: Negative chest. Electronically Signed   By: Monte Fantasia M.D.   On: 09/09/2018 05:40   Ct Abdomen Pelvis W Contrast  Result Date: 09/09/2018 CLINICAL DATA:  58 year old male with history of mid to upper abdominal pain and right-sided rib pain with nausea and vomiting for the past 6 days. EXAM: CT ABDOMEN AND PELVIS WITH CONTRAST TECHNIQUE: Multidetector CT imaging of the abdomen and pelvis was performed using the standard protocol following bolus administration of intravenous contrast. CONTRAST:  171mL OMNIPAQUE IOHEXOL 300 MG/ML  SOLN COMPARISON:  No priors. FINDINGS: Lower chest: Unremarkable. Hepatobiliary: No suspicious cystic or solid hepatic lesions. No intra or extrahepatic biliary ductal dilatation. Status post cholecystectomy. Pancreas: No pancreatic mass. No pancreatic ductal dilatation. No pancreatic or peripancreatic fluid or inflammatory changes. Spleen: Unremarkable. Adrenals/Urinary Tract: 3.8 cm low-attenuation lesion in the interpolar region of the left kidney is compatible with a simple cyst. Right  kidney and bilateral adrenal glands are normal in appearance. No hydroureteronephrosis. Urinary bladder is normal in appearance. Stomach/Bowel: Normal appearance of the stomach. Severe mural thickening in the duodenum, particularly in the region of the duodenal bulb and second portion of the duodenum. On axial image 33 of series 3 and coronal image 81 of series 6 there appears to be a small focal outpouching from the posterior aspect of the duodenal bulb and proximal second portion of the duodenum, highly concerning for ulcer. Subtle surrounding inflammatory changes in the adjacent retroperitoneal fat. No pathologic dilatation of distal portions of small bowel or colon. A few colonic diverticulae are noted, particularly in the sigmoid  colon, without surrounding inflammatory changes to suggest an acute diverticulitis at this time. Normal appendix. Vascular/Lymphatic: Aortic atherosclerosis, without evidence of aneurysm or dissection in the abdominal or pelvic vasculature. No lymphadenopathy noted in the abdomen or pelvis. Reproductive: Prostate gland and seminal vesicles are unremarkable in appearance. Other: No significant volume of ascites.  No pneumoperitoneum. Musculoskeletal: There are no aggressive appearing lytic or blastic lesions noted in the visualized portions of the skeleton. IMPRESSION: 1. Findings are highly concerning for duodenitis with probable duodenal ulcer involving the posterior aspect of the duodenal bulb and proximal second portion of the duodenum. Further evaluation by Gastroenterology is strongly recommended. 2. Colonic diverticulosis without evidence of acute diverticulitis at this time. 3. Aortic atherosclerosis. Electronically Signed   By: Vinnie Langton M.D.   On: 09/09/2018 07:29   US Abdomen Limited Ruq  Result Date: 09/09/2018 CLINICAL DATA:  58 year old male with right upper quadrant abdominal pain since February 19th. Prior cholecystectomy. EXAM: ULTRASOUND ABDOMEN LIMITED RIGHT UPPER QUADRANT COMPARISON:  CT Abdomen and Pelvis earlier today. FINDINGS: Gallbladder: Surgically absent. Common bile duct: Diameter: 6 millimeters, upper limits of normal. Liver: No intrahepatic biliary ductal dilatation. Echogenic liver (image 25). No discrete liver lesion. Portal vein is patent on color Doppler imaging with normal direction of blood flow towards the liver. Other findings: Negative visible right kidney. No free fluid. The duodenum which appeared abnormal on the earlier CT is not evaluated. IMPRESSION: 1. No evidence of biliary obstruction. Surgically absent gallbladder. 2. Hepatic steatosis. 3. The duodenum which appeared abnormal on the earlier CT is not evaluated by ultrasound. Electronically Signed   By: Genevie Ann  M.D.   On: 09/09/2018 07:57    Pending Labs Unresulted Labs (From admission, onward)    Start     Ordered   09/10/18 0500  CBC  Tomorrow morning,   R     09/09/18 0852   09/10/18 5638  Basic metabolic panel  Tomorrow morning,   R     09/09/18 0852   09/10/18 0500  H Pylori, IGM, IGG, IGA AB  Tomorrow morning,   R     09/09/18 1115   09/09/18 0929  ABO/Rh  Once,   R     09/09/18 0929   09/09/18 0849  HIV antibody (Routine Testing)  Once,   R     09/09/18 0852   09/09/18 0417  Blood culture (routine x 2)  BLOOD CULTURE X 2,   STAT     09/09/18 0416          Vitals/Pain Today's Vitals   09/09/18 1030 09/09/18 1045 09/09/18 1100 09/09/18 1105  BP: (!) 153/100 (!) 145/95 (!) 149/94   Pulse:   79   Resp: 16 11 17    Temp:      TempSrc:      SpO2:  93% 97% 96%   Weight:      Height:      PainSc:    0-No pain    Isolation Precautions No active isolations  Medications Medications  pantoprazole (PROTONIX) 80 mg in sodium chloride 0.9 % 250 mL (0.32 mg/mL) infusion (8 mg/hr Intravenous New Bag/Given 09/09/18 0837)  pantoprazole (PROTONIX) injection 40 mg (has no administration in time range)  acetaminophen (TYLENOL) tablet 650 mg (has no administration in time range)    Or  acetaminophen (TYLENOL) suppository 650 mg (has no administration in time range)  polyethylene glycol (MIRALAX / GLYCOLAX) packet 17 g (has no administration in time range)  ondansetron (ZOFRAN) tablet 4 mg (has no administration in time range)    Or  ondansetron (ZOFRAN) injection 4 mg (has no administration in time range)  dextrose 5 %-0.45 % sodium chloride infusion (has no administration in time range)  sodium chloride 0.9 % bolus 1,000 mL (0 mLs Intravenous Stopped 09/09/18 0724)  ondansetron (ZOFRAN) injection 4 mg (4 mg Intravenous Given 09/09/18 0503)  sodium chloride 0.9 % bolus 1,000 mL (0 mLs Intravenous Stopped 09/09/18 1106)  iohexol (OMNIPAQUE) 300 MG/ML solution 100 mL (100 mLs Intravenous Contrast  Given 09/09/18 0640)  pantoprazole (PROTONIX) 80 mg in sodium chloride 0.9 % 100 mL IVPB (0 mg Intravenous Stopped 09/09/18 0831)    Mobility walks Low fall risk   Focused Assessments GI   R Recommendations: See Admitting Provider Note  Report given to:   Additional Notes: pt has wife at bedside

## 2018-09-09 NOTE — Progress Notes (Addendum)
[facility] Consult: 10:14 AM 09/09/2018  LOS: 0 days    Referring Provider: Dr Evette Doffing  Primary Care Physician:  Yvone Neu, MD Primary Gastroenterologist:  Dr. Carlean Purl     Reason for Consultation:  N/V, weight loss   HPI: Caleb Mason is a 58 y.o. male.  PMH kidney stones.  S/p cystoscopy, ureteral stent.  Biliary pancreatitis.  S/P cholecystectomy 2008.   04/2012 Colonoscopy.  Average risk screening study.  Sigmoid diverticulosis.  6 mm, sessile polyp removed (tubular adenoma without high-grade dysplasia). 05/2015 Colonoscopy: For change in bowel habits, polyp surveillance.  Sigmoid diverticulosis.  No recurrent polyps. No previous EGD  2 weeks of predominantly postprandial nausea vomiting.  No hematemesis, coffee ground or coffee like emesis.  Pain in the right upper quadrant/epigastrium with p.o. intake, resolved after emesis.  More recently having heartburn when he lays flat.  Took Alka-seltzer antacid but not using PPI, H2 blocker.   Seen by local MD 08/25/2018.  Concerned re respiratory status.  He was given 5 days of prednisone, some IV fluids. Second visit to local MD 08/30/2018.  He was anemic but FOBT negative.  Chest x-ray showed what look like pneumonia,  Given IM abx, Zithromax and OTC iron. Between the 2 visits he had 3 or 4 days of dark stools.  Consistency of stools was soft which is his normal.  After 4 days, stools resumed brown color. They were darker once he started iron but stopped iron several days ago and stools now brown.   Because of persistent inability to keep down p.o., 20 # weight loss and feeling awful, he came to [facility] ED for evaluation.  WBC 17.1.  Lactate 2.6 >> 1.2 with hydration.  Hgb 13.3. Na 133.  Renal fx ok.  Glucose 122.  LFTs, lipase normal. U/A unremarkable.    Troponin I normal. Stool FOBT negative.   Blood cultures pending Iron, iron sats low, ferritin 99.  Folate and B12 ok.    CT Ab/pelvis w contrast:   Severe mural thickening particularly at the duodenal bulb and proximal second duodenum.  Focal outpouching at the posterior duodenal bulb/D2 concerning for ulcer.  Uncomplicated colonic diverticulosis. Abdominal ultrasound: [place] steatosis.  Surgically absent GB.  Normal, nondilated biliary tree.  Duodenum not evaluated on this ultrasound study.  Patient started on Protonix drip.  NPO.   At home he does not take aspirin powders, aspirin products or NSAIDs.  Fm Hx negative for ulcers, colon cancer, colon polyps.       Past Medical History:  Diagnosis Date  . Adenomatous colon polyp 2013   30mm, tubular  . Allergy    seasonal  . Broken hip ([place]) left  . Diverticulosis   . Kidney stones   . Lumbar back pain 2011   had hip fracture that caused lower back problems  . Pancreatitis     Past Surgical History:  Procedure Laterality Date  . CHOLECYSTECTOMY  2008   had pancreatitis  . COLONOSCOPY W/ POLYPECTOMY    . CYSTOSCOPY WITH RETROGRADE PYELOGRAM, URETEROSCOPY AND STENT PLACEMENT  Right 09/15/2012   Procedure: CYSTOSCOPY WITH right  RETROGRADE PYELOGRAM, right  URETEROSCOPY AND STENT PLACEMENT;  Surgeon: Bernestine Amass, MD;  Location: [facility];  Service: Urology;  Laterality: Right;  . HOLMIUM LASER APPLICATION Right 1/61/0960   Procedure: HOLMIUM LASER APPLICATION;  Surgeon: Bernestine Amass, MD;  Location: [facility];  Service: Urology;  Laterality: Right;  . KNEE ARTHROSCOPY Right 1993   right    Prior to Admission medications   Not on File    Scheduled Meds: . [START ON 09/12/2018] pantoprazole  40 mg Intravenous Q12H   Infusions: . pantoprozole (PROTONIX) infusion 8 mg/hr (09/09/18 0837)   PRN Meds: acetaminophen **OR** acetaminophen, ondansetron **OR** ondansetron (ZOFRAN) IV,  polyethylene glycol   Allergies as of 09/09/2018  . (No Known Allergies)    Family History  Problem Relation Age of Onset  . Heart disease Mother   . Colon cancer Neg Hx   . Esophageal cancer Neg Hx   . Pancreatic cancer Neg Hx   . Stomach cancer Neg Hx   . Liver disease Neg Hx     Social History   Socioeconomic History  . Marital status: Married    Spouse name: Not on file  . Number of children: 2  . Years of education: Not on file  . Highest education level: Not on file  Occupational History  . Occupation: Probation officer  Social Needs  . Financial resource strain: Not on file  . Food insecurity:    Worry: Not on file    Inability: Not on file  . Transportation needs:    Medical: Not on file    Non-medical: Not on file  Tobacco Use  . Smoking status: Former Smoker    Types: Cigarettes    Last attempt to quit: 09/15/2007    Years since quitting: 10.9  . Smokeless tobacco: Never Used  Substance and Sexual Activity  . Alcohol use: No    Alcohol/week: 0.0 standard drinks  . Drug use: No  . Sexual activity: Not on file  Lifestyle  . Physical activity:    Days per week: Not on file    Minutes per session: Not on file  . Stress: Not on file  Relationships  . Social connections:    Talks on phone: Not on file    Gets together: Not on file    Attends religious service: Not on file    Active member of club or organization: Not on file    Attends meetings of clubs or organizations: Not on file    Relationship status: Not on file  . Intimate partner violence:    Fear of current or ex partner: Not on file    Emotionally abused: Not on file    Physically abused: Not on file    Forced sexual activity: Not on file  Other Topics Concern  . Not on file  Social History Narrative   Married 2 sons   Network engineer and farmer - [place]   4 caffeinated beverages qd   04/03/2015    REVIEW OF SYSTEMS: Constitutional: Tired, weak ENT:  No nose  bleeds Pulm: No current shortness of breath or cough. CV:  No palpitations, no LE edema.  No chest pain GU:  No hematuria, no frequency GI: Per HPI Heme: No excessive or unusual bleeding or bruising. Transfusions: None Neuro:  No headaches, no peripheral tingling or numbness.  No syncope.  No seizures. Derm:  No itching, no rash or  sores.  Endocrine:  No sweats or chills.  No polyuria or dysuria Immunization: Did not query him on recent vaccinations. Travel:  None beyond local counties in last few months.    PHYSICAL EXAM: Vital signs in last 24 hours: Vitals:   09/09/18 0915 09/09/18 0930  BP: (!) 179/99 139/78  Pulse:    Resp: 11 (!) 9  Temp:    SpO2: 98% 97%   Wt Readings from Last 3 Encounters:  09/09/18 110.2 kg  11/26/15 114.7 kg  05/09/15 114.3 kg    General: Looks a little tired and unwell but not acutely ill-appearing Head: No facial asymmetry or swelling.  No signs of head trauma. Eyes: No conjunctival pallor.  No scleral icterus.  EOMI. Ears: Not hard of hearing Nose: No congestion or discharge. Mouth: Tongue midline.  Oral mucosa moist, pink, clear. Neck: No JVD, no masses, no thyromegaly. Lungs: No labored breathing or cough.  Lungs clear bilaterally. Heart: RRR.  No MRG.  S1, S2 present Abdomen: Soft.  Not tender or distended.  No HSM, masses, bruits, hernias..   Rectal: Deferred Musc/Skeltl: No obvious joint contracture deformities or swelling. Extremities: No CCE. Neurologic: Alert and oriented x3.  Moves all 4 limbs without tremor.  Full strength.  No gross deficits. Skin: No rashes, no sores, no telangiectasia. Nodes: No cervical adenopathy Psych: Cooperative, pleasant, fluid speech.  Calm.  Intake/Output from previous day: No intake/output data recorded. Intake/Output this shift: Total I/O In: 1000 [IV Piggyback:1000] Out: -   LAB RESULTS: Recent Labs    09/09/18 0450  WBC 17.1*  HGB 13.3  HCT 41.0  PLT 531*   BMET Lab Results   Component Value Date   NA 133 (L) 09/09/2018   K 3.7 09/09/2018   CL 99 09/09/2018   CO2 19 (L) 09/09/2018   GLUCOSE 122 (H) 09/09/2018   BUN 9 09/09/2018   CREATININE 1.03 09/09/2018   CALCIUM 9.2 09/09/2018   LFT Recent Labs    09/09/18 0450  PROT 7.4  ALBUMIN 3.5  AST 23  ALT 32  ALKPHOS 80  BILITOT 0.7   PT/INR No results found for: INR, PROTIME Hepatitis Panel No results for input(s): HEPBSAG, HCVAB, HEPAIGM, HEPBIGM in the last 72 hours. C-Diff No components found for: CDIFF Lipase     Component Value Date/Time   LIPASE 47 09/09/2018 0450    Drugs of Abuse  No results found for: LABOPIA, COCAINSCRNUR, LABBENZ, AMPHETMU, THCU, LABBARB   RADIOLOGY STUDIES: Dg Chest 2 View  Result Date: 09/09/2018 CLINICAL DATA:  Right rib pain.  20 pound weight loss for 2 weeks. EXAM: CHEST - 2 VIEW COMPARISON:  None. FINDINGS: The heart size and mediastinal contours are within normal limits. Both lungs are clear. The visualized skeletal structures are unremarkable. Cholecystectomy clips. IMPRESSION: Negative chest. Electronically Signed   By: Monte Fantasia M.D.   On: 09/09/2018 05:40   Ct Abdomen Pelvis W Contrast  Result Date: 09/09/2018 CLINICAL DATA:  58 year old male with history of mid to upper abdominal pain and right-sided rib pain with nausea and vomiting for the past 6 days. EXAM: CT ABDOMEN AND PELVIS WITH CONTRAST TECHNIQUE: Multidetector CT imaging of the abdomen and pelvis was performed using the standard protocol following bolus administration of intravenous contrast. CONTRAST:  111mL OMNIPAQUE IOHEXOL 300 MG/ML  SOLN COMPARISON:  No priors. FINDINGS: Lower chest: Unremarkable. Hepatobiliary: No suspicious cystic or solid hepatic lesions. No intra or extrahepatic biliary ductal dilatation. Status post cholecystectomy. Pancreas: No pancreatic mass.  No pancreatic ductal dilatation. No pancreatic or peripancreatic fluid or inflammatory changes. Spleen: Unremarkable.  Adrenals/Urinary Tract: 3.8 cm low-attenuation lesion in the interpolar region of the left kidney is compatible with a simple cyst. Right kidney and bilateral adrenal glands are normal in appearance. No hydroureteronephrosis. Urinary bladder is normal in appearance. Stomach/Bowel: Normal appearance of the stomach. Severe mural thickening in the duodenum, particularly in the region of the duodenal bulb and second portion of the duodenum. On axial image 33 of series 3 and coronal image 81 of series 6 there appears to be a small focal outpouching from the posterior aspect of the duodenal bulb and proximal second portion of the duodenum, highly concerning for ulcer. Subtle surrounding inflammatory changes in the adjacent retroperitoneal fat. No pathologic dilatation of distal portions of small bowel or colon. A few colonic diverticulae are noted, particularly in the sigmoid colon, without surrounding inflammatory changes to suggest an acute diverticulitis at this time. Normal appendix. Vascular/Lymphatic: Aortic atherosclerosis, without evidence of aneurysm or dissection in the abdominal or pelvic vasculature. No lymphadenopathy noted in the abdomen or pelvis. Reproductive: Prostate gland and seminal vesicles are unremarkable in appearance. Other: No significant volume of ascites.  No pneumoperitoneum. Musculoskeletal: There are no aggressive appearing lytic or blastic lesions noted in the visualized portions of the skeleton. IMPRESSION: 1. Findings are highly concerning for duodenitis with probable duodenal ulcer involving the posterior aspect of the duodenal bulb and proximal second portion of the duodenum. Further evaluation by Gastroenterology is strongly recommended. 2. Colonic diverticulosis without evidence of acute diverticulitis at this time. 3. Aortic atherosclerosis. Electronically Signed   By: Vinnie Langton M.D.   On: 09/09/2018 07:29   US Abdomen Limited Ruq  Result Date: 09/09/2018 CLINICAL DATA:   58 year old male with right upper quadrant abdominal pain since February 19th. Prior cholecystectomy. EXAM: ULTRASOUND ABDOMEN LIMITED RIGHT UPPER QUADRANT COMPARISON:  CT Abdomen and Pelvis earlier today. FINDINGS: Gallbladder: Surgically absent. Common bile duct: Diameter: 6 millimeters, upper limits of normal. Liver: No intrahepatic biliary ductal dilatation. Echogenic liver (image 25). No discrete liver lesion. Portal vein is patent on color Doppler imaging with normal direction of blood flow towards the liver. Other findings: Negative visible right kidney. No free fluid. The duodenum which appeared abnormal on the earlier CT is not evaluated. IMPRESSION: 1. No evidence of biliary obstruction. Surgically absent gallbladder. 2. Hepatic steatosis. 3. The duodenum which appeared abnormal on the earlier CT is not evaluated by ultrasound. Electronically Signed   By: Genevie Ann M.D.   On: 09/09/2018 07:57     IMPRESSION:   *   Post prandial N/V with  20 #weight loss/2 weeks. CT showing likely duodenal ulcer.   Though no obstruction on CT, certainly sxs s/o GOO.   Now on Protonix gtt and NPO.      PLAN:     *   Continue Protonix gtt, IVF, NPO.  No EGD yet, as pt at risk of perforation.  Case D/W Dr Henrene Pastor.  Plan is for EGD tmrw at 11:30 AM.      *   Serum H Pylori Ab ordered.     *   Added IVF of d5/1/2 NS at 125 hour.  He received 2 liters IVF but needs maintenance IVF til able to take PO.     Azucena Freed  09/09/2018, 10:14 AM Phone [phone]  GI ATTENDING  History, laboratories, x-rays reviewed.  Patient personally seen and examined.  Agree with comprehensive consultation note as  outlined above.  Patient presents after persistent respiratory illness with complaints of postprandial abdominal discomfort with nausea and vomiting.  Laboratories unremarkable.  Stool Hemoccult negative.  Ultrasound unrevealing.  CT scan shows changes suggesting duodenal ulcer disease.  Agree with plans for IV  hydration, nothing by mouth except sips or chips, and IV PPI.  As well antiemetics as needed.  Plans for diagnostic upper endoscopy tomorrow morning at approximately 11:30 AM.The nature of the procedure, as well as the risks, benefits, and alternatives were carefully and thoroughly reviewed with the patient. Ample time for discussion and questions allowed. The patient understood, was satisfied, and agreed to proceed.  Docia Chuck. Geri Seminole., M.D. [facility] of Gastroenterology

## 2018-09-09 NOTE — ED Triage Notes (Signed)
Pt c/o right sided chest/rib pain 2/10 in severity. Pt states that he has lost 20 pounds in two weeks. States that he has been feeling ill since feb 19th. Was diagnosed with PNA and given prednisone. Pt states that symptoms have not resolved and that he has been unable to keep anything down and has been vomiting since Sat.

## 2018-09-09 NOTE — H&P (Signed)
Date: 09/09/2018               Patient Name:  Caleb Mason MRN: [id number]  DOB: 18-Oct-1960 Age / Sex: 58 y.o., male   PCP: Laney Pastor Jenetta Downer, MD         Medical Service: Internal Medicine Teaching Service         Attending Physician: Dr. Evette Doffing, Mallie Mussel, *    First Contact: Dr. Laural Golden Pager: [phone]  Second Contact: Dr. Philipp Ovens Pager: [phone]       After Hours (After 5p/  First Contact Pager: [phone]  weekends / holidays): Second Contact Pager: [phone]   Chief Complaint: Epigastric pain with nausea and vomiting.  History of Present Illness: : Caleb Mason is a 58 y.o. male with PMHx of kidney stones S/p cystoscopy, ureteral stent.  Biliary pancreatitis.  S/P cholecystectomy 2008 presented to ED with complaint of persistent epigastric pain along with nausea and vomiting for than a week.  According to patient he developed some dry cough and was becoming very dizzy with coughing so he sees his PCP and received a course of prednisone and some IV fluids on 08/25/18.  His symptoms continue to get worse so he saw another physician on 08/30/18 and chest x-ray was done with suspicion for pneumonia and he was given a course of Z-Pak.  He was also noticed to have anemia during his second visit with negative FOBT and was also given some iron supplement.  In between these 2 visits he developed epigastric pain and had few dark-colored loose bowel movements, which resolved on its own.  His stool did become dark again when he started taking iron supplement, he stopped taking iron because of worsening abdominal pain and his stool become normal again. He was also complaining of some hemoptysis during that time. His cough and upper respiratory symptoms has been improved. He continued to experience worsening abdominal pain along with nausea and vomiting to the point that he was unable to keep anything down.  Describes his pain as sharp in epigastrium and right upper quadrant region,  nonradiating and increased with any p.o. intake.  Was also complaining of 10 to 20 pound weight loss during these 2 weeks.  Denies any NSAID use. He denies any diarrhea or hematochezia. He denies any fever, chills, shortness of breath, chest pain or body aches. He denies any urinary symptoms.  ED course.  On presentation he was hemodynamically stable,, afebrile, mildly elevated blood pressure, labs with mild leukocytosis, lactic acid of 2.6 which resolved quickly, mild hyponatremia with sodium of 133, bicarb of 19, and negative FOBT.  CT abdomen shows with possible duodenal ulcer and RUQ [place] was negative for any bile duct dilatation, mild hepatic steatosis.  GI was consulted, he was placed on Protonix gtt and internal medicine was asked to admit.  Meds:  No outpatient medications have been marked as taking for the 09/09/18 encounter [facility] Encounter).   Allergies: Allergies as of 09/09/2018  . (No Known Allergies)   Past Medical History:  Diagnosis Date  . Adenomatous colon polyp 2013   12mm, tubular  . Allergy    seasonal  . Broken hip ([place]) left  . Complication of anesthesia   . Diverticulosis   . Kidney stones   . Lumbar back pain 2011   had hip fracture that caused lower back problems  . Pancreatitis   . Pancreatitis   . PONV (postoperative nausea and vomiting)   . PUD (peptic ulcer disease)  Family History: Mom is an ovarian cancer survivor and father with hypertension.  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 Quit smoking 20 years ago and denies any alcohol or illicit drug use.  Review of Systems: A complete ROS was negative except as per HPI.   Physical Exam: Blood pressure 134/88, pulse 82, temperature 98.1 F (36.7 C), temperature source Oral, resp. rate 16, height 6\' 1"  (1.854 m), weight 110.2 kg, SpO2 97 %. Vitals:   09/09/18 1030 09/09/18 1045 09/09/18 1100 09/09/18  1149  BP: (!) 153/100 (!) 145/95 (!) 149/94 134/88  Pulse:   79 82  Resp: 16 11 17 16   Temp:    98.1 F (36.7 C)  TempSrc:    Oral  SpO2: 93% 97% 96% 97%  Weight:      Height:       General: Vital signs reviewed.  Patient is well-developed and well-nourished, in no acute distress and cooperative with exam.  Head: Normocephalic and atraumatic. Eyes: EOMI, conjunctivae normal, no scleral icterus.  Neck: Supple, trachea midline, normal ROM, no JVD, masses, thyromegaly, or carotid bruit present.  Cardiovascular: RRR, S1 normal, S2 normal, no murmurs, gallops, or rubs. Pulmonary/Chest: Clear to auscultation bilaterally, no wheezes, rales, or rhonchi. Abdominal: Soft, tenderness along epigastrium and right upper quadrant, non-distended, BS +, no masses, organomegaly, or guarding present.  Extremities: No lower extremity edema bilaterally,  pulses symmetric and intact bilaterally. No cyanosis or clubbing. Neurological: A&O x3, Strength is normal and symmetric bilaterally, cranial nerve II-XII are grossly intact, no focal motor deficit, sensory intact to light touch bilaterally.  Skin: Warm, dry and intact. No rashes or erythema. Psychiatric: Normal mood and affect. speech and behavior is normal. Cognition and memory are normal.  EKG: personally reviewed my interpretation is mild sinus tachycardia, otherwise normal EKG.  CXR: personally reviewed my interpretation is no acute abnormality.  Assessment & Plan by Problem: Caleb Mason is a 58 y.o. male with PMHx of kidney stones S/p cystoscopy, ureteral stent.  Biliary pancreatitis S/P cholecystectomy 2008 presented to ED with complaint of persistent epigastric pain along with nausea and vomiting for than a week.  Active Problems:   PUD (peptic ulcer disease) With CT abdomen suspecting duodenal ulcer, GI was consulted, they advised to continue Protonix gtt, and EGD tomorrow around 11 AM. Patient denies any NSAID use but recently got a course of  steroid for 5 days.  No sign of active bleeding and hemoglobin stable at 13.3. -Patient will remain n.p.o. with ice chips. -Zofran for nausea and vomiting. -Protonix gtt. -5%-half saline at 125 mL/h.  Lactic acidosis.  Resolved pretty quickly with 2 L of normal saline in ED.  History of diverticulosis.  No sign of diverticulitis on CT. had colonoscopy in 2013 with sigmoid diverticulosis and a C-cell polyp was removed at that time which shows tubular adenoma without any high-grade dysplasia.  Repeat colonoscopy was done in 2016 due to change in bowel habits with no recurrent polyp and similar findings about sigmoid diverticulosis.  DVT prophylaxis.  SCDs CODE STATUS.  Full Diet.  N.p.o.  Dispo: Admit patient to Observation with expected length of stay less than 2 midnights.  SignedLorella Nimrod, MD 09/09/2018, 2:12 PM  Pager: [phone]

## 2018-09-09 NOTE — ED Notes (Signed)
Patient transported to CT 

## 2018-09-10 ENCOUNTER — Encounter (HOSPITAL_COMMUNITY)
Admission: EM | Disposition: A | Discharge status: Home / Self Care | Payer: Self-pay | Source: Home / Self Care | Attending: Student in an Organized Health Care Education/Training Program

## 2018-09-10 ENCOUNTER — Ambulatory Visit (HOSPITAL_COMMUNITY)
Admission: RE | Admit: 2018-09-10 | Payer: BLUE CROSS/BLUE SHIELD | Source: Home / Self Care | Admitting: Internal Medicine

## 2018-09-10 ENCOUNTER — Encounter (HOSPITAL_COMMUNITY): Payer: Self-pay | Admitting: Anesthesiology

## 2018-09-10 ENCOUNTER — Inpatient Hospital Stay (HOSPITAL_COMMUNITY): Payer: BLUE CROSS/BLUE SHIELD | Admitting: Anesthesiology

## 2018-09-10 DIAGNOSIS — K2100 Gastro-esophageal reflux disease with esophagitis, without bleeding: Secondary | ICD-10-CM

## 2018-09-10 DIAGNOSIS — K449 Diaphragmatic hernia without obstruction or gangrene: Secondary | ICD-10-CM

## 2018-09-10 DIAGNOSIS — K315 Obstruction of duodenum: Secondary | ICD-10-CM

## 2018-09-10 DIAGNOSIS — R1013 Epigastric pain: Secondary | ICD-10-CM

## 2018-09-10 DIAGNOSIS — K279 Peptic ulcer, site unspecified, unspecified as acute or chronic, without hemorrhage or perforation: Secondary | ICD-10-CM

## 2018-09-10 HISTORY — PX: BIOPSY: SHX5522

## 2018-09-10 HISTORY — PX: ESOPHAGOGASTRODUODENOSCOPY (EGD) WITH PROPOFOL: SHX5813

## 2018-09-10 LAB — BASIC METABOLIC PANEL
ANION GAP: 7 (ref 5–15)
BUN: 6 mg/dL (ref 6–20)
CO2: 22 mmol/L (ref 22–32)
Calcium: 8.4 mg/dL — ABNORMAL LOW (ref 8.9–10.3)
Chloride: 107 mmol/L (ref 98–111)
Creatinine, Ser: 0.92 mg/dL (ref 0.61–1.24)
GFR calc Af Amer: >60 mL/min (ref >60)
GFR calc non Af Amer: >60 mL/min (ref >60)
Glucose, Bld: 118 mg/dL — ABNORMAL HIGH (ref 70–99)
Potassium: 3.4 mmol/L — ABNORMAL LOW (ref 3.5–5.1)
Sodium: 136 mmol/L (ref 135–145)

## 2018-09-10 LAB — CBC
HCT: 33.4 % — ABNORMAL LOW (ref 39.0–52.0)
Hemoglobin: 11 g/dL — ABNORMAL LOW (ref 13.0–17.0)
MCH: 28.9 pg (ref 26.0–34.0)
MCHC: 32.9 g/dL (ref 30.0–36.0)
MCV: 87.9 fL (ref 80.0–100.0)
NRBC: 0 % (ref 0.0–0.2)
Platelets: 369 10*3/uL (ref 150–400)
RBC: 3.8 MIL/uL — ABNORMAL LOW (ref 4.22–5.81)
RDW: 12.9 % (ref 11.5–15.5)
WBC: 8.6 10*3/uL (ref 4.0–10.5)

## 2018-09-10 LAB — HIV ANTIBODY (ROUTINE TESTING W REFLEX): HIV Screen 4th Generation wRfx: NONREACTIVE

## 2018-09-10 LAB — GLUCOSE, CAPILLARY
Glucose-Capillary: 110 mg/dL — ABNORMAL HIGH (ref 70–99)
Glucose-Capillary: 107 mg/dL — ABNORMAL HIGH (ref 70–99)

## 2018-09-10 SURGERY — ESOPHAGOGASTRODUODENOSCOPY (EGD) WITH PROPOFOL
Anesthesia: Monitor Anesthesia Care

## 2018-09-10 MED ORDER — LACTATED RINGERS IV SOLN
INTRAVENOUS | Status: AC | PRN
  Administered 2018-09-10: 1000 mL via INTRAVENOUS

## 2018-09-10 MED ORDER — PROPOFOL 500 MG/50ML IV EMUL
INTRAVENOUS | Status: DC | PRN
  Administered 2018-09-10: 100 ug/kg/min via INTRAVENOUS

## 2018-09-10 MED ORDER — PROPOFOL 10 MG/ML IV BOLUS
INTRAVENOUS | Status: DC | PRN
  Administered 2018-09-10: 30 mg via INTRAVENOUS

## 2018-09-10 MED ORDER — BUTAMBEN-TETRACAINE-BENZOCAINE 2-2-14 % EX AERO
INHALATION_SPRAY | CUTANEOUS | Status: DC | PRN
  Administered 2018-09-10: 2 via TOPICAL

## 2018-09-10 MED ORDER — SUCRALFATE 1 GM/10ML PO SUSP
1.0000 g | Freq: Three times a day (TID) | ORAL | Status: DC
  Administered 2018-09-10 – 2018-09-11 (×4): 1 g via ORAL
  Filled 2018-09-10 (×4): qty 10

## 2018-09-10 SURGICAL SUPPLY — 14 items

## 2018-09-10 NOTE — Progress Notes (Signed)
   Subjective: No overnight events. Mr. Stief reported feeling well this morning. He states his nausea and pain have improved since he came in. He states he has been having ongoing nausea, vomiting and abdominal pain for 2 weeks. He noticed some dark stools over the last two weeks. He denies any pain medication use or previous ulcers. No family history of inflammatory bowel disease. All questions and concerns were addressed.   Objective:  Vital signs in last 24 hours: Vitals:   09/09/18 1100 09/09/18 1149 09/10/18 0018 09/10/18 0504  BP: (!) 149/94 134/88 (!) 143/84 (!) 143/93  Pulse: 79 82 75 60  Resp: 17 16 18 18   Temp:  98.1 F (36.7 C) 97.9 F (36.6 C) 97.9 F (36.6 C)  TempSrc:  Oral Oral Oral  SpO2: 96% 97% 95% 95%  Weight:    111.5 kg  Height:       Gen: seen comfortably laying in bed, no distress CV: RRR, no murmurs  Abdomen: soft, non-distended, tenderness in the epigastric region, bowel sounds present Ext: no edema, no tenderness  Skin: warm and dry   Assessment/Plan:  Active Problems:   PUD (peptic ulcer disease)   Peptic ulcer disease  Sajjad Devallis a 58 y.o.male withPMHx ofkidney stonesS/p cystoscopy, ureteral stent. Biliary pancreatitisS/P cholecystectomy 2008 presented to ED with complaint of persistent epigastric pain along with nausea and vomiting for than a week.  PUD (peptic ulcer disease) - Hgb 11, down from 13.3 - EGD showed a hiatal hernia, lower third esophagitis, one large non-bleeding, cratered duodenal ulcer with no bleeding at the junction of the duodenal bulb and 1st portion of the duodenum. This was biopsied.  - GI recommends to continue protonix 40 mg IV BID and transition to PO protonix 40 mg BID tomorrow for 10 weeks and to use sucralfate suspension 1 gram PO QID 2 weeks - Repeat upper endoscopy in 8 weeks to check healing.  Due to the size and location of the ulcer, if rebleeding, recommend IR consultation for consideration of CT  angiography with coil embolization - Clear liquid diet today, advance as tolerated tomorrow  - Will discontinue IVF as long as he tolerates clear liquid diet  - Zofran for nausea and vomiting - AM CBC - Appreciate GI recommendations   Dispo: Anticipated discharge in approximately 1 day(s).   Mike Craze, DO 09/10/2018, 6:34 AM Pager: [phone]

## 2018-09-10 NOTE — Transfer of Care (Signed)
Immediate Anesthesia Transfer of Care Note  Patient: Caleb Mason  Procedure(s) Performed: ESOPHAGOGASTRODUODENOSCOPY (EGD) WITH PROPOFOL (N/A ) BIOPSY  Patient Location: Endoscopy Unit  Anesthesia Type:MAC  Level of Consciousness: awake, oriented and patient cooperative  Airway & Oxygen Therapy: Patient Spontanous Breathing and Patient connected to nasal cannula oxygen  Post-op Assessment: Report given to RN and Post -op Vital signs reviewed and stable  Post vital signs: Reviewed  Last Vitals:  Vitals Value Taken Time  BP 118/61 09/10/2018 12:18 PM  Temp    Pulse 83 09/10/2018 12:19 PM  Resp 11 09/10/2018 12:19 PM  SpO2 99 % 09/10/2018 12:19 PM  Vitals shown include unvalidated device data.  Last Pain:  Vitals:   09/10/18 1107  TempSrc: Oral  PainSc: 0-No pain         Complications: No apparent anesthesia complications

## 2018-09-10 NOTE — Anesthesia Postprocedure Evaluation (Signed)
Anesthesia Post Note  Patient: Caleb Mason  Procedure(s) Performed: ESOPHAGOGASTRODUODENOSCOPY (EGD) WITH PROPOFOL (N/A ) BIOPSY     Patient location during evaluation: PACU Anesthesia Type: MAC Level of consciousness: awake and alert Pain management: pain level controlled Vital Signs Assessment: post-procedure vital signs reviewed and stable Respiratory status: spontaneous breathing, nonlabored ventilation, respiratory function stable and patient connected to nasal cannula oxygen Cardiovascular status: stable and blood pressure returned to baseline Postop Assessment: no apparent nausea or vomiting Anesthetic complications: no    Last Vitals:  Vitals:   09/10/18 1224 09/10/18 1230  BP:  106/61  Pulse: 78 74  Resp: 14 14  Temp:    SpO2: 98% 99%    Last Pain:  Vitals:   09/10/18 1219  TempSrc: Oral  PainSc: 0-No pain                 Barnet Glasgow

## 2018-09-10 NOTE — Op Note (Signed)
[facility] Patient Name: Caleb Mason Procedure Date : 09/10/2018 MRN: [id number] Attending MD: Gerrit Heck , MD Date of Birth: [birth year] CSN: [id number] Age: 58 Admit Type: Inpatient Procedure:                Upper GI endoscopy Indications:              Epigastric abdominal pain, Abdominal pain in the                            right upper quadrant, Heartburn, Abnormal CT of the                            GI tract, Nausea with vomiting Providers:                Gerrit Heck, MD, Jeanella Cara, RN,                            Elspeth Cho Tech., Technician, Luciana Axe,                            CRNA Referring MD:              Medicines:                Monitored Anesthesia Care Complications:            No immediate complications. Estimated Blood Loss:     Estimated blood loss was minimal. Procedure:                Pre-Anesthesia Assessment:                           - Prior to the procedure, a History and Physical                            was performed, and patient medications and                            allergies were reviewed. The patient's tolerance of                            previous anesthesia was also reviewed. The risks                            and benefits of the procedure and the sedation                            options and risks were discussed with the patient.                            All questions were answered, and informed consent                            was obtained. Prior Anticoagulants: The patient has  taken no previous anticoagulant or antiplatelet                            agents. ASA Grade Assessment: II - A patient with                            mild systemic disease. After reviewing the risks                            and benefits, the patient was deemed in                            satisfactory condition to undergo the procedure.                           After obtaining  informed consent, the endoscope was                            passed under direct vision. Throughout the                            procedure, the patient's blood pressure, pulse, and                            oxygen saturations were monitored continuously. The                            GIF-H190 ([id number]) Olympus gastroscope was                            introduced through the mouth, and advanced to the                            second part of duodenum. The upper GI endoscopy was                            accomplished without difficulty. The patient                            tolerated the procedure well. Scope In: Scope Out: Findings:      Esophagogastric landmarks were identified: the gastroesophageal junction       was found at 38 cm and the site of hiatal narrowing was found at 42 cm       from the incisors.      A 4 cm hiatal hernia was present.      LA Grade D (one or more mucosal breaks involving at least 75% of       esophageal circumference) esophagitis with no bleeding was found 31 to       38 cm from the incisors.      The gastroesophageal flap valve was visualized endoscopically and       classified as Hill Grade III (minimal fold, loose to endoscope, hiatal       hernia likely).      The upper third of the esophagus and middle third  of the esophagus were       normal.      The gastric fundus, gastric body, incisura, gastric antrum, prepyloric       region of the stomach and pylorus were normal. Biopsies were taken with       a cold forceps for Helicobacter pylori testing. Estimated blood loss was       minimal.      The second portion of the duodenum was normal.      One non-bleeding cratered duodenal ulcer with no stigmata of bleeding       was found at the junction of the duodenal bulb and the first portion of       the duodenum. The lesion was 12 mm in largest dimension. The ulcer was       clean based and without high grade stigmata. No endoscopic intervention        was indicated based on this appearance. There was a moderate stenosis       immediately distal to the ulcer, which was traversed. There was a small       mucosal rent after passage of the endoscope, and bleeding stopped prior       to the end of this study. Multiple mucosal biopsies were taken with a       cold forceps for histology. Estimated blood loss was minimal. Impression:               - Esophagogastric landmarks identified as above.                           - 4 cm hiatal hernia.                           - LA Grade D reflux esophagitis.                           - Gastroesophageal flap valve classified as Hill                            Grade III (minimal fold, loose to endoscope, hiatal                            hernia likely).                           - Normal upper third of esophagus and middle third                            of esophagus.                           - Normal gastric fundus, gastric body, incisura,                            antrum, prepyloric region of the stomach and                            pylorus. Biopsied.                           -  Normal second portion of the duodenum.                           - One large, non-bleeding, cratered duodenal ulcer                            with no stigmata of bleeding was found at the                            junction of the duodenal bulb and 1st portion of                            the duodenum. This was biopsied at the proximal and                            distal borders.                           - Moderate stenosis immediately distal to the                            ulcer, which was traversed. There was a small                            mucosal rent after passage of the endoscope,                            consistent with stenosis dilation. Bleeding stopped                            prior to the end of this study. Recommendation:           - Return patient to hospital ward for ongoing care.                            - Clear liquid diet today, then can slowly advance                            as tolerated tomorrow.                           - Continue present medications.                           - Resume Protonix 40 mg IV BID through today, then                            can transition to Protonix (pantoprazole) 40 mg PO                            BID for 10 weeks to promote ulcer healing along                            with healing of the erosive  esophagitis.                           - Await pathology results.                           - Use sucralfate suspension 1 gram PO QID for 2                            weeks.                           - Repeat upper endoscopy in 8 weeks to check                            healing.                           - Due to the size and location of the ulcer, if                            rebleeding, recommend IR consultation for                            consideration of CT angiography with coil                            embolization. Procedure Code(s):        --- Professional ---                           [phone], Esophagogastroduodenoscopy, flexible,                            transoral; with biopsy, single or multiple Diagnosis Code(s):        --- Professional ---                           K44.9, Diaphragmatic hernia without obstruction or                            gangrene                           K21.0, Gastro-esophageal reflux disease with                            esophagitis                           K26.9, Duodenal ulcer, unspecified as acute or                            chronic, without hemorrhage or perforation                           R10.13, Epigastric pain  R10.11, Right upper quadrant pain                           R12, Heartburn                           R11.2, Nausea with vomiting, unspecified                           R93.3, Abnormal findings on diagnostic imaging of                             other parts of digestive tract CPT copyright 2018 [employer]. All rights reserved. The codes documented in this report are preliminary and upon coder review may  be revised to meet current compliance requirements. Gerrit Heck, MD 09/10/2018 12:25:46 PM Number of Addenda: 0

## 2018-09-10 NOTE — Progress Notes (Signed)
Nutrition Brief Note  Patient identified on the Malnutrition Screening Tool (MST) Report  Wt Readings from Last 15 Encounters:  09/10/18 111.6 kg  11/26/15 114.7 kg  05/09/15 114.3 kg  04/03/15 114.5 kg  09/15/12 106.7 kg  04/16/12 104.3 kg  04/02/12 104.3 kg   Caleb Mason is a 58 y.o. male with PMHx of kidney stones S/p cystoscopy, ureteral stent.  Biliary pancreatitis.  S/P cholecystectomy 2008 presented to ED with complaint of persistent epigastric pain along with nausea and vomiting for than a week.  Pt admitted with PUD.   Reviewed I/O's: +2.7 L since admission  Spoke with pt at bedside, who reports feeling a lot better today. He is tolerating clear liquids well and feels like he could eat something more substantial 9"like a ribeye steak").   Pt reports he generally has a very good appetite, however, the last solid food he ate was an egg salad sandwich 3 days ago. Pt reports over the period it became increasing difficult to keep foods and liquids down (he was unable to jeep down water last night).   Pt endorses a 10# wt loss over the past month, however, this is not consistent with hx hx. Bed scale wt was 114.5 kg. Nutrition-Focused physical exam completed. Findings are no fat depletion, no muscle depletion, and no edema.   Discussed potential for diet progression and what that would look like. Pt with no further nutrition-related questions at this time, however, expressed appreciation for visit.   Labs reviewed: K: 3.4, CBGS: 107-110.  Body mass index is 32.46 kg/m. Patient meets criteria for obesity, class II based on current BMI.   Current diet order is clear liquid, patient is consuming approximately 100% of meals at this time. Labs and medications reviewed.   No nutrition interventions warranted at this time. If nutrition issues arise, please consult RD.   Seger Jani A. Jimmye Norman, RD, LDN, [place] Registered Dietitian II Certified Diabetes Care and Education  Specialist Pager: [phone] After hours Pager: [phone]

## 2018-09-10 NOTE — Anesthesia Procedure Notes (Signed)
Procedure Name: MAC Date/Time: 09/10/2018 11:45 AM Performed by: Jenne Campus, CRNA Pre-anesthesia Checklist: Patient identified, Emergency Drugs available, Suction available and Patient being monitored Oxygen Delivery Method: Nasal cannula

## 2018-09-10 NOTE — Interval H&P Note (Signed)
History and Physical Interval Note:  09/10/2018 11:08 AM  Caleb Mason  has presented today for surgery, with the diagnosis of duodenal ulcer.  post prandial N/V.  weight loss  The various methods of treatment have been discussed with the patient and family. After consideration of risks, benefits and other options for treatment, the patient has consented to  Procedure(s): ESOPHAGOGASTRODUODENOSCOPY (EGD) WITH PROPOFOL (N/A) as a surgical intervention .  The patient's history has been reviewed, patient examined, no change in status, stable for surgery.  I have reviewed the patient's chart and labs.  Questions were answered to the patient's satisfaction.     Dominic Pea Asuka Dusseau

## 2018-09-11 ENCOUNTER — Encounter (HOSPITAL_COMMUNITY): Payer: Self-pay | Admitting: Gastroenterology

## 2018-09-11 DIAGNOSIS — R935 Abnormal findings on diagnostic imaging of other abdominal regions, including retroperitoneum: Secondary | ICD-10-CM

## 2018-09-11 DIAGNOSIS — K311 Adult hypertrophic pyloric stenosis: Secondary | ICD-10-CM

## 2018-09-11 LAB — CBC
HCT: 34 % — ABNORMAL LOW (ref 39.0–52.0)
Hemoglobin: 11.2 g/dL — ABNORMAL LOW (ref 13.0–17.0)
MCH: 29.2 pg (ref 26.0–34.0)
MCHC: 32.9 g/dL (ref 30.0–36.0)
MCV: 88.8 fL (ref 80.0–100.0)
Platelets: 390 10*3/uL (ref 150–400)
RBC: 3.83 MIL/uL — ABNORMAL LOW (ref 4.22–5.81)
RDW: 12.8 % (ref 11.5–15.5)
WBC: 7.7 10*3/uL (ref 4.0–10.5)
nRBC: 0 % (ref 0.0–0.2)

## 2018-09-11 LAB — GLUCOSE, CAPILLARY: Glucose-Capillary: 119 mg/dL — ABNORMAL HIGH (ref 70–99)

## 2018-09-11 MED ORDER — PANTOPRAZOLE SODIUM 40 MG PO TBEC: 40.0000 mg | DELAYED_RELEASE_TABLET | Freq: Two times a day (BID) | ORAL | 0 refills | Status: DC

## 2018-09-11 MED ORDER — SUCRALFATE 1 GM/10ML PO SUSP: 1.0000 g | Freq: Three times a day (TID) | ORAL | 0 refills | Status: DC

## 2018-09-11 MED ORDER — PANTOPRAZOLE SODIUM 40 MG PO TBEC: 40.0000 mg | DELAYED_RELEASE_TABLET | Freq: Two times a day (BID) | ORAL | 1 refills | Status: DC

## 2018-09-11 MED ORDER — PANTOPRAZOLE SODIUM 40 MG PO TBEC
40.0000 mg | DELAYED_RELEASE_TABLET | Freq: Two times a day (BID) | ORAL | Status: DC
  Administered 2018-09-11: 40 mg via ORAL
  Filled 2018-09-11: qty 1

## 2018-09-11 NOTE — Progress Notes (Addendum)
    Progress Note   Subjective  Chief Complaint: Nausea/vomiting, weight loss, status post EGD 09/10/2018  Today, the patient tells me that he is tolerating his clear liquid diet, he is eager to try something else.  Also eager to get home.  Denies any further nausea or vomiting or abdominal pain.   Objective   Vital signs in last 24 hours: Temp:  [97.5 F (36.4 C)-98.8 F (37.1 C)] 98 F (36.7 C) (03/07 0859) Pulse Rate:  [60-83] 62 (03/07 0859) Resp:  [11-20] 20 (03/07 0859) BP: (106-140)/(61-96) 125/80 (03/07 0859) SpO2:  [97 %-99 %] 97 % (03/07 0859) Weight:  [111.4 kg-111.6 kg] 111.4 kg (03/06 2122) Last BM Date: 09/10/18 General:    white male in NAD Heart:  Regular rate and rhythm; no murmurs Lungs: Respirations even and unlabored, lungs CTA bilaterally Abdomen:  Soft, nontender and nondistended. Normal bowel sounds. Extremities:  Without edema. Neurologic:  Alert and oriented,  grossly normal neurologically. Psych:  Cooperative. Normal mood and affect.  Intake/Output from previous day: 03/06 0701 - 03/07 0700 In: 1287.9 [P.O.:960; I.V.:327.9] Out: 625 [Urine:625] Intake/Output this shift: Total I/O In: 120 [P.O.:120] Out: -   Lab Results: Recent Labs    09/09/18 0450 09/10/18 0313 09/11/18 0500  WBC 17.1* 8.6 7.7  HGB 13.3 11.0* 11.2*  HCT 41.0 33.4* 34.0*  PLT 531* 369 390   BMET Recent Labs    09/09/18 0450 09/10/18 0313  NA 133* 136  K 3.7 3.4*  CL 99 107  CO2 19* 22  GLUCOSE 122* 118*  BUN 9 6  CREATININE 1.03 0.92  CALCIUM 9.2 8.4*   LFT Recent Labs    09/09/18 0450  PROT 7.4  ALBUMIN 3.5  AST 23  ALT 32  ALKPHOS 80  BILITOT 0.7     Assessment / Plan:   Assessment: 1.  Postprandial nausea and vomiting with a 20 pound weight loss in 2 weeks: EGD 09/10/2018 with duodenal ulcer and stenosis causing mild gastric outlet obstruction, now improving after IV PPI  Plan: 1.  Continue Pantoprazole 40 mg twice daily for the next 10 weeks at  least, further recommendations per Dr. Carlean Purl 2.  Advance diet as tolerated slowly, discussed soft foods for the next 3 to 5 days 3.  Sucralfate 1 g p.o. 4 times daily x2 weeks 4.  Patient should follow-up in our outpatient clinic in 4 weeks with Dr. Carlean Purl or APP.  Will need repeat EGD in 8 weeks.  Our office will contact the patient on Monday with appointment. 5.  Patient may be discharged from a GI standpoint today.  Thanks for your kind consultation.  We will sign off.   LOS: 2 days   Levin Erp  09/11/2018, 12:00 PM  GI ATTENDING  Interval history and data reviewed.  Agree with interval progress note as outlined above.  Patient with duodenal ulcer resulting in postprandial pain and partial gastric outlet obstruction.  Doing better since admission on PPI with modified diet.  Helical back to pylori testing is pending.  Okay to discharge home on twice daily PPI with GI follow-up as outlined above.  Docia Chuck. Geri Seminole., M.D. [facility] Division of Gastroenterology

## 2018-09-11 NOTE — Progress Notes (Signed)
   Subjective: No acute events overnight. Caleb Mason is doing well this morning. He slept well. He denies abdominal or chest pain. He denies nausea or vomiting. He is tolerating a full liquid diet, but has not tried solid foods yet. We discussed his endoscopy results and pending biopsy. He feels comfortable being discharged.  Objective:  Vital signs in last 24 hours: Vitals:   09/10/18 1756 09/10/18 2122 09/11/18 0520 09/11/18 0859  BP: 140/87 131/83 (!) 137/96 125/80  Pulse: 73 68 60 62  Resp: 18 18 18 20   Temp: 97.7 F (36.5 C) 98.5 F (36.9 C) (!) 97.5 F (36.4 C) 98 F (36.7 C)  TempSrc: Oral Oral Oral Oral  SpO2: 98% 99% 98% 97%  Weight:  111.4 kg    Height:       Physical exam: General: Pt is resting comfortably, NAD Cardiovascular: regular rate and rhythm, no m/r/g Respiratory: lungs are cta bilaterally Abdominal: soft, no distention or tenderness. Normoactive bowel sounds  Assessment/Plan:  Active Problems:   PUD (peptic ulcer disease)   Peptic ulcer disease   Abdominal pain, epigastric   Gastroesophageal reflux disease with esophagitis   Hiatal hernia   Duodenal ulcer   Duodenal stenosis  Caleb Mason a 58 y.o.malewithPMHx ofkidney stonesS/p cystoscopy, ureteral stent. Biliary pancreatitisS/P cholecystectomy 2008presented to ED with complaint of persistent epigastric pain along with nausea and vomiting for than a week. EGD on 3/6 showed a hiatal hernia, lower third esophagitis, one large non-bleeding, cratered duodenal ulcer with no bleeding at the junction of the duodenal bulb and 1st portion of the duodenum. This was biopsied.   PUD (peptic ulcer disease): Appreciate GI consultation and recommendations.  - Hgb 11.2, up from 11 on 3/6 - d/c IVF - Advance diet - Zofran for nausea and vomiting - Transition IV Protonix -> PO 40 mg BID for 10 weeks - sucralfate suspension 1 gram PO QID for 2 weeks - Repeat upper endoscopy in 8 weeks to check healing.   Due to the size and location of the ulcer, if significant rebleeding, recommend IR consultation for consideration of CT angiography with coil embolization   Dispo: Anticipated discharge is today  LOS: 2 days   Velna Ochs, MD 09/11/2018, 10:26 AM Pager: [phone]

## 2018-09-11 NOTE — Progress Notes (Signed)
Patient discharged to home, AVS reviewed, prescriptions provided, all questions answered. IV removed and telebox returned.

## 2018-09-11 NOTE — Discharge Summary (Signed)
Name: Caleb Mason MRN: [id number] DOB: 1960-08-28 58 y.o. PCP: Caleb Neu, MD  Date of Admission: 09/09/2018  3:37 AM Date of Discharge: 09/11/2018 Attending Physician: Axel Filler, *  Discharge Diagnosis: 1. Duodenal ulcer  Discharge Medications: Allergies as of 09/11/2018   No Known Allergies     Medication List    TAKE these medications   pantoprazole 40 MG tablet Commonly known as:  Protonix Take 1 tablet (40 mg total) by mouth 2 (two) times daily.   sucralfate 1 GM/10ML suspension Commonly known as:  CARAFATE Take 10 mLs (1 g total) by mouth 4 (four) times daily -  with meals and at bedtime for 14 days.       Disposition and follow-up:   CalebCaleb Mason was discharged from [facility] in Stable condition.  At the hospital follow up visit please address:  1.  Duodenal ulcer. Mr. Bhagat should have a follow-up EGD 8 weeks after discharge to evaluate duodenal healing. He is being discharged on PO protonix 40mg  bid for 10 days and sulcrafate 1g 4x daily for 2 weeks. Follow up GI outpatient 4 weeks, plan repeat EGD 8 weeks.   2.  Labs / imaging needed at time of follow-up: CBC  3.  Pending labs/ test needing follow-up: EGD biopsy path report  Follow-up Appointments: Follow-up Information    Mason, Caleb V, DO. Schedule an appointment as soon as possible for a visit.   Specialty:  Gastroenterology Contact information: [place] [address] [phone]        Caleb Neu, MD. Schedule an appointment as soon as possible for a visit in 1 week(s).   Specialty:  Family Medicine Contact information: [facility] [place] [address] [phone]           Follow-up with your primary care provider within 1-2 weeks of discharge Follow-up with gastroenterology in 8 weeks for repeat EGD.  Hospital Course by problem list:  1. Duodenal  ulcer: Caleb Mason presented to the ED with a 2-week history of food intolerance, nausea, vomiting, and abdominal pain. His symptoms began with dizziness and dry cough, which were treated outpatient with prednisone and rehydration on 2/19. His symptoms progressed and he was treated with azithromycin for suspected pneumonia on 2/24. On 2/24 he was found to be anemic with a negative FOBT. Caleb Mason symptoms persisted and he began to experience abdominal pain. The abdominal pain was centrally located and did not radiate. His cough was intermittently bloody, and he began to have nausea and vomiting. He was unable to eat for two weeks prior to presentation in the ED, with a 20 lb weight loss during that time. He had no diarrhea or bloody stool, no fever/chills, shortness of breath, or chest pain. In the emergency room, Caleb Mason was found to be hypertensive to 153/100. He had epigastric and RUQ tenderness with normal bowel sounds and an otherwise unremarkable exam. Laboratory findings were significant for a lactic acid of 2.6, Na+ of 133, WBCs of 17.1 His troponin was <0.03. Hb was 13.3. His EKG showed sinus tachycardia. Chest xray showed no acute abnormality. CT scan of the abdomen was suspicious for a duodenal ulcer vs. duodenitis. He received 2L IV fluids and made n.p.o, which improved his abdominal pain and nausea. His lactic acid normalized. He was placed on Protonix gtt and admitted to the IMTS with GI consulting. On 3/6 [name] had  an EGD which was significant for reflux esophagitis and a large non-bleeding duodenal ulcer with moderate neighboring stenosis. Biopsy was performed. He was transitioned from Protonix gtt to IV Protonix 40mg  bid and PO sulcrafate 1g qid. Repeat labs on 3/6 showed resolution of hyponatremia, Hb 11, and WBC of 8.6. On 3/6 he was advanced to a clear liquid diet, which he tolerated well. On 3/7 he was placed on a full liquid diet with no nausea, vomiting, or abdominal pain. Repeat CBC  showed Hb of 11.2 and WBC of 7.7. He was transitioned to oral Protonix before discharge. He is being discharged on Protonix 40mg  bid and sulcrafate 1g qid. Folllow up GI outpatient in 4 weeks and planned repeat EGD in 8 weeks.   Discharge Vitals:   BP 125/80 (BP Location: Right Arm)   Pulse 62   Temp 98 F (36.7 C) (Oral)   Resp 20   Ht 6\' 1"  (1.854 m)   Wt 111.4 kg   SpO2 97%   BMI 32.40 kg/m   Pertinent Labs, Studies, and Procedures:   CBC Latest Ref Rng & Units 09/11/2018 09/10/2018 09/09/2018  WBC 4.0 - 10.5 K/uL 7.7 8.6 17.1(H)  Hemoglobin 13.0 - 17.0 g/dL 11.2(L) 11.0(L) 13.3  Hematocrit 39.0 - 52.0 % 34.0(L) 33.4(L) 41.0  Platelets 150 - 400 K/uL 390 369 531(H)   CMP Latest Ref Rng & Units 09/10/2018 09/09/2018  Glucose 70 - 99 mg/dL 118(H) 122(H)  BUN 6 - 20 mg/dL 6 9  Creatinine 0.61 - 1.24 mg/dL 0.92 1.03  Sodium 135 - 145 mmol/L 136 133(L)  Potassium 3.5 - 5.1 mmol/L 3.4(L) 3.7  Chloride 98 - 111 mmol/L 107 99  CO2 22 - 32 mmol/L 22 19(L)  Calcium 8.9 - 10.3 mg/dL 8.4(L) 9.2  Total Protein 6.5 - 8.1 g/dL - 7.4  Total Bilirubin 0.3 - 1.2 mg/dL - 0.7  Alkaline Phos 38 - 126 U/L - 80  AST 15 - 41 U/L - 23  ALT 0 - 44 U/L - 32   Lipase 47 on 3/5  Dg Chest 2 View  Result Date: 09/09/2018 CLINICAL DATA:  Right rib pain.  20 pound weight loss for 2 weeks. EXAM: CHEST - 2 VIEW COMPARISON:  None. FINDINGS: The heart size and mediastinal contours are within normal limits. Both lungs are clear. The visualized skeletal structures are unremarkable. Cholecystectomy clips. IMPRESSION: Negative chest. Electronically Signed   By: Monte Fantasia M.D.   On: 09/09/2018 05:40   Ct Abdomen Pelvis W Contrast  Result Date: 09/09/2018 CLINICAL DATA:  58 year old male with history of mid to upper abdominal pain and right-sided rib pain with nausea and vomiting for the past 6 days. EXAM: CT ABDOMEN AND PELVIS WITH CONTRAST TECHNIQUE: Multidetector CT imaging of the abdomen and pelvis was performed  using the standard protocol following bolus administration of intravenous contrast. CONTRAST:  148mL OMNIPAQUE IOHEXOL 300 MG/ML  SOLN COMPARISON:  No priors. FINDINGS: Lower chest: Unremarkable. Hepatobiliary: No suspicious cystic or solid hepatic lesions. No intra or extrahepatic biliary ductal dilatation. Status post cholecystectomy. Pancreas: No pancreatic mass. No pancreatic ductal dilatation. No pancreatic or peripancreatic fluid or inflammatory changes. Spleen: Unremarkable. Adrenals/Urinary Tract: 3.8 cm low-attenuation lesion in the interpolar region of the left kidney is compatible with a simple cyst. Right kidney and bilateral adrenal glands are normal in appearance. No hydroureteronephrosis. Urinary bladder is normal in appearance. Stomach/Bowel: Normal appearance of the stomach. Severe mural thickening in the duodenum, particularly in the region of the duodenal  bulb and second portion of the duodenum. On axial image 33 of series 3 and coronal image 81 of series 6 there appears to be a small focal outpouching from the posterior aspect of the duodenal bulb and proximal second portion of the duodenum, highly concerning for ulcer. Subtle surrounding inflammatory changes in the adjacent retroperitoneal fat. No pathologic dilatation of distal portions of small bowel or colon. A few colonic diverticulae are noted, particularly in the sigmoid colon, without surrounding inflammatory changes to suggest an acute diverticulitis at this time. Normal appendix. Vascular/Lymphatic: Aortic atherosclerosis, without evidence of aneurysm or dissection in the abdominal or pelvic vasculature. No lymphadenopathy noted in the abdomen or pelvis. Reproductive: Prostate gland and seminal vesicles are unremarkable in appearance. Other: No significant volume of ascites.  No pneumoperitoneum. Musculoskeletal: There are no aggressive appearing lytic or blastic lesions noted in the visualized portions of the skeleton. IMPRESSION: 1.  Findings are highly concerning for duodenitis with probable duodenal ulcer involving the posterior aspect of the duodenal bulb and proximal second portion of the duodenum. Further evaluation by Gastroenterology is strongly recommended. 2. Colonic diverticulosis without evidence of acute diverticulitis at this time. 3. Aortic atherosclerosis. Electronically Signed   By: Vinnie Langton M.D.   On: 09/09/2018 07:29   US Abdomen Limited Ruq  Result Date: 09/09/2018 CLINICAL DATA:  58 year old male with right upper quadrant abdominal pain since February 19th. Prior cholecystectomy. EXAM: ULTRASOUND ABDOMEN LIMITED RIGHT UPPER QUADRANT COMPARISON:  CT Abdomen and Pelvis earlier today. FINDINGS: Gallbladder: Surgically absent. Common bile duct: Diameter: 6 millimeters, upper limits of normal. Liver: No intrahepatic biliary ductal dilatation. Echogenic liver (image 25). No discrete liver lesion. Portal vein is patent on color Doppler imaging with normal direction of blood flow towards the liver. Other findings: Negative visible right kidney. No free fluid. The duodenum which appeared abnormal on the earlier CT is not evaluated. IMPRESSION: 1. No evidence of biliary obstruction. Surgically absent gallbladder. 2. Hepatic steatosis. 3. The duodenum which appeared abnormal on the earlier CT is not evaluated by ultrasound. Electronically Signed   By: Genevie Ann M.D.   On: 09/09/2018 07:57    Results of EGD from 3/6: Esophagogastric landmarks were identified: the gastroesophageal junction was found at 38 cm and the site of hiatal narrowing was found at 42 cm from the incisors. A 4 cm hiatal hernia was present. LA Grade D (one or more mucosal breaks involving at least 75% of esophageal circumference) esophagitis with no bleeding was found 31 to 38 cm from the incisors. The gastroesophageal flap valve was visualized endoscopically and classified as Hill Grade III (minimal fold, loose to endoscope, hiatal hernia likely).The  upper third of the esophagus and middle third of the esophagus were normal. The gastric fundus, gastric body, incisura, gastric antrum, prepyloric region of the stomach and pylorus were normal. Biopsies were taken with a cold forceps for Helicobacter pylori testing. Estimated blood loss was minimal. The second portion of the duodenum was normal. One non-bleeding cratered duodenal ulcer with no stigmata of bleeding was found at the junction of the duodenal bulb and the first portion of the duodenum. The lesion was 12 mm in largest dimension. The ulcer was clean based and without high grade stigmata. No endoscopic intervention was indicated based on this appearance. There was a moderate stenosis immediately distal to the ulcer, which was traversed. There was a small mucosal rent after passage of the endoscope, and bleeding stopped prior to the end of this study. Multiple mucosal biopsies  were taken with a cold forceps for histology. Estimated blood loss was minimal. Impression:       - Esophagogastric landmarks identified as above.                           - 4 cm hiatal hernia.                           - LA Grade D reflux esophagitis.                           - Gastroesophageal flap valve classified as Hill                            Grade III (minimal fold, loose to endoscope, hiatal                            hernia likely).                           - Normal upper third of esophagus and middle third                            of esophagus.                           - Normal gastric fundus, gastric body, incisura,                            antrum, prepyloric region of the stomach and                            pylorus. Biopsied.                           - Normal second portion of the duodenum.                           - One large, non-bleeding, cratered duodenal ulcer                            with no stigmata of bleeding was found at the                            junction of the duodenal bulb  and 1st portion of                            the duodenum. This was biopsied at the proximal and                            distal borders.                           - Moderate stenosis immediately distal to the  ulcer, which was traversed. There was a small                            mucosal rent after passage of the endoscope,                            consistent with stenosis dilation. Bleeding stopped                            prior to the end of this study.  Discharge Instructions: Discharge Instructions    Call MD for:  persistant dizziness or light-headedness   Complete by:  As directed    Call MD for:  persistant nausea and vomiting   Complete by:  As directed    Call MD for:  severe uncontrolled pain   Complete by:  As directed    Diet - low sodium heart healthy   Complete by:  As directed    Discharge instructions   Complete by:  As directed    Mr. Schmelzle,  It was a pleasure to be involved in your care during this hospitalization. I hope that you you continue to heal and that this duodenal ulcer does not persist. It will be important for your healing that you continue to take the sulcrafate 1g four times daily and the protonix 40mg  two times daily for the next two weeks. Avoid smoking, alcohol, and ibuprofen, as these can worsen ulcers. Try to moderate your consumption of spicy and high-fat foods, chocolate, and caffeine. Stay hydrated with water, and use tylenol to manage any pain you might have.   It is also important to schedule and outpatient appointment with the Gastroenterology physicians in 8 weeks so they can look at your intestines and make sure that the ulcer is healing appropriately. In the meantime, please meet with your primary care provider for a hospital follow-up visit. Reach out to them if you notice progressive pain, inability to eat, vomiting, or blood in your stools.   Best,  Adline Potter, M4 Dr. Velna Ochs   Increase  activity slowly   Complete by:  As directed       Signed: Velna Ochs, MD 09/11/2018, 12:37 PM   Pager: [phone]

## 2018-09-13 ENCOUNTER — Telehealth: Payer: Self-pay

## 2018-09-13 ENCOUNTER — Telehealth: Payer: Self-pay | Admitting: Internal Medicine

## 2018-09-13 LAB — H PYLORI, IGM, IGG, IGA AB
H Pylori IgG: 0.2 Index Value (ref 0.00–0.79)
H. Pylogi, Iga Abs: <9 units (ref 0.0–8.9)
H. Pylogi, Igm Abs: <9 units (ref 0.0–8.9)

## 2018-09-13 NOTE — Telephone Encounter (Signed)
Patient advised that after he has tolerated a regular diet for about a week he can add in Metamucil. All questions answered.

## 2018-09-13 NOTE — Telephone Encounter (Signed)
-----   Message from [place], [place] sent at 09/11/2018 12:04 PM EST ----- Regarding: Appt Needs appt with Carlean Purl or APP in 4 weeks for follow up duodenal ulcer-will need repeat EGD in 8 weeks with Dr. Carlean Purl (can go ahead and schedule if available). Patient discharge today 09/11/18-will need to be called with appt  Thanks-JLL

## 2018-09-13 NOTE — Telephone Encounter (Signed)
Appt made for 10/11/18 at 10 am with Dr Carlean Purl.  Pt notified via My Chart mail box full

## 2018-09-13 NOTE — Telephone Encounter (Signed)
Pt had hosp EGD and inquired if "he could take metamucil qAM with the special diet."

## 2018-09-14 LAB — CULTURE, BLOOD (ROUTINE X 2)
Culture: NO GROWTH
Culture: NO GROWTH
SPECIAL REQUESTS: ADEQUATE
Special Requests: ADEQUATE

## 2018-09-23 ENCOUNTER — Encounter: Payer: Self-pay | Admitting: Gastroenterology

## 2018-09-27 ENCOUNTER — Telehealth: Payer: Self-pay | Admitting: Internal Medicine

## 2018-09-27 NOTE — Telephone Encounter (Signed)
Patient informed and he will stay on his pantoprazole and he's aware visit type may change.

## 2018-09-27 NOTE — Telephone Encounter (Signed)
Stay on pantoprazole  Leave on schedule and explain we may end up doing phone/telehealth visit

## 2018-09-27 NOTE — Telephone Encounter (Signed)
Pt wife called in wanting to know do her husband still stay on the protonix  She will like a call back to advised.

## 2018-09-27 NOTE — Telephone Encounter (Signed)
Please advise Sir. He has an appointment to come in for a f/u on his duodenal ulcer on 10/11/2018. Thank you.

## 2018-10-11 ENCOUNTER — Ambulatory Visit: Payer: BLUE CROSS/BLUE SHIELD | Admitting: Internal Medicine

## 2018-11-02 ENCOUNTER — Other Ambulatory Visit: Payer: Self-pay

## 2018-11-02 ENCOUNTER — Ambulatory Visit (INDEPENDENT_AMBULATORY_CARE_PROVIDER_SITE_OTHER): Payer: BLUE CROSS/BLUE SHIELD | Admitting: Internal Medicine

## 2018-11-02 ENCOUNTER — Encounter: Payer: Self-pay | Admitting: Internal Medicine

## 2018-11-02 DIAGNOSIS — K311 Adult hypertrophic pyloric stenosis: Secondary | ICD-10-CM | POA: Insufficient documentation

## 2018-11-02 DIAGNOSIS — K2100 Gastro-esophageal reflux disease with esophagitis, without bleeding: Secondary | ICD-10-CM

## 2018-11-02 DIAGNOSIS — K267 Chronic duodenal ulcer without hemorrhage or perforation: Secondary | ICD-10-CM

## 2018-11-02 DIAGNOSIS — D649 Anemia, unspecified: Secondary | ICD-10-CM | POA: Diagnosis not present

## 2018-11-02 HISTORY — DX: Adult hypertrophic pyloric stenosis: K31.1

## 2018-11-02 HISTORY — DX: Chronic duodenal ulcer without hemorrhage or perforation: K26.7

## 2018-11-02 MED ORDER — PANTOPRAZOLE SODIUM 40 MG PO TBEC: 40.0000 mg | DELAYED_RELEASE_TABLET | Freq: Every day | ORAL | 3 refills | Status: DC

## 2018-11-02 NOTE — Assessment & Plan Note (Signed)
Appropriate to see response to Tx and document healing w/ EGD ? GERD vs reflux due to GOO from ulcer

## 2018-11-02 NOTE — Patient Instructions (Addendum)
Good to talk to you today and hear that you are feeling better.  We will mail you orders to get  CBC and ferritin tests to check hemoglobin and iron levels. Please have these faxed to me at [phone]  We will contact you in may or June about having a repeat endoscopy in June.  I sent prescription for pantoprazole (for 1 year) to pharmacy.   I appreciate the opportunity to care for you. Silvano Rusk, MD, [facility]

## 2018-11-02 NOTE — Assessment & Plan Note (Addendum)
Clinically improved Anticipate repeat EGD in June - will place on call list Go to qd ppi H pylori abs were negative Probable chronic PPI (at least 1 year)

## 2018-11-02 NOTE — Progress Notes (Signed)
    TELEHEALTH ENCOUNTER IN SETTING OF COVID-19 PANDEMIC - REQUESTED BY PATIENT SERVICE PROVIDED BY TELEMEDECINE - TYPE: Phone - failed Zoom PATIENT LOCATION: Home PATIENT HAS CONSENTED TO TELEHEALTH VISIT PROVIDER LOCATION: OFFICE REFERRING PROVIDER:PCP Dellia Nims FNP PARTICIPANTS OTHER THAN PATIENT:None TIME SPENT ON CALL: 10 minutes    Caleb Mason 58 y.o. 21-Sep-1960 [id number]  Assessment & Plan:   Chronic duodenal ulcer with gastric outlet obstruction Clinically improved Anticipate repeat EGD in June Go to qd ppi H pylori abs were negative Probable chronic PPI (at least 1 year)  Gastroesophageal reflux disease with esophagitis Appropriate to see response to Tx and document healing w/ EGD ? GERD vs reflux due to GOO from ulcer  Mild anemia Presumably from ulcer Last Hgb 11 Ferritin was 99  CBC and ferritin - will mail an order to him to do in [place] and fax back results     Subjective:   Chief Complaint:  HPI  No Known Allergies Current Meds  Medication Sig  . pantoprazole (PROTONIX) 40 MG tablet Take 1 tablet (40 mg total) by mouth 2 (two) times daily.   Past Medical History:  Diagnosis Date  . Adenomatous colon polyp 2013   3mm, tubular  . Allergy    seasonal  . Anemia   . Broken hip ([place]) left  . Chronic duodenal ulcer with gastric outlet obstruction 11/02/2018  . Complication of anesthesia   . Diverticulosis   . Kidney stones   . Lumbar back pain 2011   had hip fracture that caused lower back problems  . Pancreatitis   . Pancreatitis   . PONV (postoperative nausea and vomiting)   . PUD (peptic ulcer disease)    Past Surgical History:  Procedure Laterality Date  . BIOPSY  09/10/2018   Procedure: BIOPSY;  Surgeon: Lavena Bullion, DO;  Location: [facility];  Service: Endoscopy;;  . CHOLECYSTECTOMY  2008   had pancreatitis  . COLONOSCOPY W/ POLYPECTOMY    . CYSTOSCOPY WITH RETROGRADE PYELOGRAM, URETEROSCOPY AND STENT PLACEMENT Right  09/15/2012   Procedure: CYSTOSCOPY WITH right  RETROGRADE PYELOGRAM, right  URETEROSCOPY AND STENT PLACEMENT;  Surgeon: Bernestine Amass, MD;  Location: [facility];  Service: Urology;  Laterality: Right;  . ESOPHAGOGASTRODUODENOSCOPY (EGD) WITH PROPOFOL N/A 09/10/2018   Procedure: ESOPHAGOGASTRODUODENOSCOPY (EGD) WITH PROPOFOL;  Surgeon: Lavena Bullion, DO;  Location: [facility];  Service: Endoscopy;  Laterality: N/A;  . HOLMIUM LASER APPLICATION Right 0/04/2724   Procedure: HOLMIUM LASER APPLICATION;  Surgeon: Bernestine Amass, MD;  Location: [facility];  Service: Urology;  Laterality: Right;  . KNEE ARTHROSCOPY Right 1993   right   Social History   Social History Narrative   Married 2 sons   Network engineer and farmer - [place]   4 caffeinated beverages qd   04/03/2015   family history includes Heart disease in his mother.   Review of Systems As above

## 2018-11-02 NOTE — Assessment & Plan Note (Signed)
Presumably from ulcer Last Hgb 11 Ferritin was 99  CBC and ferritin - will mail an order to him to do in [place] and fax back results

## 2018-11-15 ENCOUNTER — Encounter: Payer: Self-pay | Admitting: Family Medicine

## 2018-11-19 ENCOUNTER — Encounter: Payer: Self-pay | Admitting: Internal Medicine

## 2018-11-30 ENCOUNTER — Other Ambulatory Visit: Payer: Self-pay

## 2018-11-30 ENCOUNTER — Encounter: Payer: Self-pay | Admitting: Internal Medicine

## 2018-11-30 ENCOUNTER — Ambulatory Visit (AMBULATORY_SURGERY_CENTER): Payer: BLUE CROSS/BLUE SHIELD | Admitting: *Deleted

## 2018-11-30 VITALS — Ht 73.0 in | Wt 260.0 lb

## 2018-11-30 DIAGNOSIS — K2100 Gastro-esophageal reflux disease with esophagitis, without bleeding: Secondary | ICD-10-CM

## 2018-11-30 DIAGNOSIS — K311 Adult hypertrophic pyloric stenosis: Secondary | ICD-10-CM

## 2018-11-30 DIAGNOSIS — K267 Chronic duodenal ulcer without hemorrhage or perforation: Secondary | ICD-10-CM

## 2018-11-30 NOTE — Progress Notes (Signed)
Patient's pre-visit was done today over the phone with the patient. Name,DOB and address verified. Insurance verified. Packet of Prep instructions mailed to patient including copy of a consent form and pre-procedure patient acknowledgement form-pt is aware. Patient understands to call us back with any questions or concerns.   Patient denies any allergies to eggs or soy. Patient denies any problems with anesthesia/sedation. Patient denies any oxygen use at home. Patient denies taking any diet/weight loss medications or blood thinners. EMMI education assisgned to patient on EGD, this was explained and instructions given to patient.

## 2018-12-10 ENCOUNTER — Telehealth: Payer: Self-pay | Admitting: *Deleted

## 2018-12-10 NOTE — Telephone Encounter (Signed)
Covid-19 screening questions  Have you traveled in the last 14 days? No. If yes where?  Do you now or have you had a fever in the last 14 days? No.  Do you have any respiratory symptoms of shortness of breath or cough now or in the last 14 days? No.  Do you have any family members or close contacts with diagnosed or suspected Covid-19 in the past 14 days? No.  Have you been tested for Covid-19 and found to be positive? No.       

## 2018-12-13 ENCOUNTER — Encounter: Payer: Self-pay | Admitting: Internal Medicine

## 2018-12-13 ENCOUNTER — Ambulatory Visit (AMBULATORY_SURGERY_CENTER): Payer: BC Managed Care – PPO | Admitting: Internal Medicine

## 2018-12-13 ENCOUNTER — Other Ambulatory Visit: Payer: Self-pay

## 2018-12-13 VITALS — BP 142/87 | HR 77 | Temp 97.5°F | Resp 13 | Ht 72.0 in | Wt 255.0 lb

## 2018-12-13 DIAGNOSIS — K315 Obstruction of duodenum: Secondary | ICD-10-CM | POA: Diagnosis not present

## 2018-12-13 DIAGNOSIS — K311 Adult hypertrophic pyloric stenosis: Secondary | ICD-10-CM | POA: Diagnosis not present

## 2018-12-13 DIAGNOSIS — K267 Chronic duodenal ulcer without hemorrhage or perforation: Secondary | ICD-10-CM | POA: Diagnosis not present

## 2018-12-13 MED ORDER — SODIUM CHLORIDE 0.9 % IV SOLN: 500.0000 mL | Freq: Once | INTRAVENOUS | Status: DC

## 2018-12-13 NOTE — Progress Notes (Signed)
No problems noted in the recovery room. 

## 2018-12-13 NOTE — Op Note (Signed)
[place] Patient Name: Caleb Mason Procedure Date: 12/13/2018 9:36 AM MRN: [id number] Endoscopist: Gatha Mayer , MD Age: 58 Referring MD:  Date of Birth: 13-Oct-1960 Gender: Male Account #: [id number] Procedure:                Upper GI endoscopy Indications:              Chronic duodenal ulcer, Follow-up of chronic                            duodenal ulcer Medicines:                Propofol per Anesthesia, Monitored Anesthesia Care Procedure:                Pre-Anesthesia Assessment:                           - Prior to the procedure, a History and Physical                            was performed, and patient medications and                            allergies were reviewed. The patient's tolerance of                            previous anesthesia was also reviewed. The risks                            and benefits of the procedure and the sedation                            options and risks were discussed with the patient.                            All questions were answered, and informed consent                            was obtained. Prior Anticoagulants: The patient has                            taken no previous anticoagulant or antiplatelet                            agents. ASA Grade Assessment: II - A patient with                            mild systemic disease. After reviewing the risks                            and benefits, the patient was deemed in                            satisfactory condition to undergo the procedure.  After obtaining informed consent, the endoscope was                            passed under direct vision. Throughout the                            procedure, the patient's blood pressure, pulse, and                            oxygen saturations were monitored continuously. The                            Endoscope was introduced through the mouth, and                            advanced to the second  part of duodenum. The upper                            GI endoscopy was accomplished without difficulty.                            The patient tolerated the procedure well. Scope In: Scope Out: Findings:                 An acquired benign-appearing, intrinsic mild                            stenosis was found in the second portion of the                            duodenum and was traversed.                           The exam was otherwise without abnormality.                           The cardia and gastric fundus were normal on                            retroflexion. Complications:            No immediate complications. Estimated Blood Loss:     Estimated blood loss: none. Impression:               - Acquired duodenal stenosis. Minimal; residual                            stricture from ulcer.                           - The examination was otherwise normal.                           - No specimens collected. Recommendation:           - Patient has a contact number available for  emergencies. The signs and symptoms of potential                            delayed complications were discussed with the                            patient. Return to normal activities tomorrow.                            Written discharge instructions were provided to the                            patient.                           - Resume previous diet.                           - Continue present medications. Stay on PPI                            chronically to reduce risk recurrent H pylori                            negative duodenal ulcer Gatha Mayer, MD 12/13/2018 9:58:43 AM This report has been signed electronically.

## 2018-12-13 NOTE — Patient Instructions (Addendum)
There is a mild narrow spot in the upper intestine where the ulcer was - but everything is healed. I do not think this explains why you feel bloated.  Please stay on the pantoprazole to prevent further ulcers.  I appreciate the opportunity to care for you. Gatha Mayer, MD, FACG    YOU HAD AN ENDOSCOPIC PROCEDURE TODAY AT [facility]:   Refer to the procedure report that was given to you for any specific questions about what was found during the examination.  If the procedure report does not answer your questions, please call your gastroenterologist to clarify.  If you requested that your care partner not be given the details of your procedure findings, then the procedure report has been included in a sealed envelope for you to review at your convenience later.  YOU SHOULD EXPECT: Some feelings of bloating in the abdomen. Passage of more gas than usual.  Walking can help get rid of the air that was put into your GI tract during the procedure and reduce the bloating. If you had a lower endoscopy (such as a colonoscopy or flexible sigmoidoscopy) you may notice spotting of blood in your stool or on the toilet paper. If you underwent a bowel prep for your procedure, you may not have a normal bowel movement for a few days.  Please Note:  You might notice some irritation and congestion in your nose or some drainage.  This is from the oxygen used during your procedure.  There is no need for concern and it should clear up in a day or so.  SYMPTOMS TO REPORT IMMEDIATELY:    Following upper endoscopy (EGD)  Vomiting of blood or coffee ground material  New chest pain or pain under the shoulder blades  Painful or persistently difficult swallowing  New shortness of breath  Fever of 100F or higher  Black, tarry-looking stools  For urgent or emergent issues, a gastroenterologist can be reached at any hour by calling [phone].   DIET:  We do recommend a small meal at  first, but then you may proceed to your regular diet.  Drink plenty of fluids but you should avoid alcoholic beverages for 24 hours.  ACTIVITY:  You should plan to take it easy for the rest of today and you should NOT DRIVE or use heavy machinery until tomorrow (because of the sedation medicines used during the test).    FOLLOW UP: Our staff will call the number listed on your records 48-72 hours following your procedure to check on you and address any questions or concerns that you may have regarding the information given to you following your procedure. If we do not reach you, we will leave a message.  We will attempt to reach you two times.  During this call, we will ask if you have developed any symptoms of COVID 19. If you develop any symptoms (ie: fever, flu-like symptoms, shortness of breath, cough etc.) before then, please call [phone].  If you test positive for Covid 19 in the 2 weeks post procedure, please call and report this information to [place].    If any biopsies were taken you will be contacted by phone or by letter within the next 1-3 weeks.  Please call us at [phone] if you have not heard about the biopsies in 3 weeks.    SIGNATURES/CONFIDENTIALITY: You and/or your care partner have signed paperwork which will be entered into your electronic medical record.  These signatures attest  to the fact that that the information above on your After Visit Summary has been reviewed and is understood.  Full responsibility of the confidentiality of this discharge information lies with you and/or your care-partner.    Per Dr. Carlean Purl Stay on PROTONIX 40 mg - 20-30 minutes before food on an empty stomach. You may resume your current medications today. Please call if any questions or concerns.

## 2018-12-13 NOTE — Progress Notes (Signed)
Pt. Reports no change in his medical or surgical history since his pre-visit 11/30/2018.

## 2018-12-13 NOTE — Progress Notes (Signed)
Report given to PACU, vss 

## 2018-12-15 ENCOUNTER — Telehealth: Payer: Self-pay | Admitting: *Deleted

## 2018-12-15 NOTE — Telephone Encounter (Signed)
  Follow up Call-  Call back number 12/13/2018  Post procedure Call Back phone  # [phone]  Permission to leave phone message Yes  Some recent data might be hidden     Patient questions:  Do you have a fever, pain , or abdominal swelling? No. Pain Score  0 *  Have you tolerated food without any problems? Yes.    Have you been able to return to your normal activities? Yes.    Do you have any questions about your discharge instructions: Diet   No. Medications  No. Follow up visit  No.  Do you have questions or concerns about your Care? No.  Actions: * If pain score is 4 or above: No action needed, pain <4. 1. Have you developed a fever since your procedure? NO  2.   Have you had an respiratory symptoms (SOB or cough) since your procedure? NO  3.   Have you tested positive for COVID 19 since your procedure NO  4.   Have you had any family members/close contacts diagnosed with the COVID 19 since your procedure?  NO   If yes to any of these questions please route to Joylene John, RN and Alphonsa Gin, RN.

## 2019-11-11 ENCOUNTER — Other Ambulatory Visit: Payer: Self-pay | Admitting: Internal Medicine

## 2020-04-16 IMAGING — US US ABDOMEN LIMITED
1 series · 14 of 25 positions shown · non-contrast
Comparison: CT Abdomen and Pelvis earlier today.

CLINICAL DATA: 57-year-old male with right upper quadrant abdominal
pain since [REDACTED]. Prior cholecystectomy.

EXAM:
ULTRASOUND ABDOMEN LIMITED RIGHT UPPER QUADRANT

[Series 1: us abdomen limited · 14 of 25 slices shown]
[im 1/25]
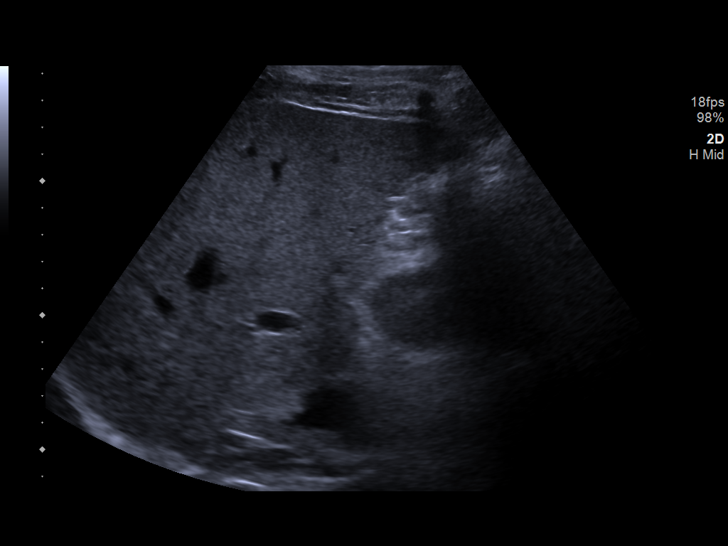
[im 3/25]
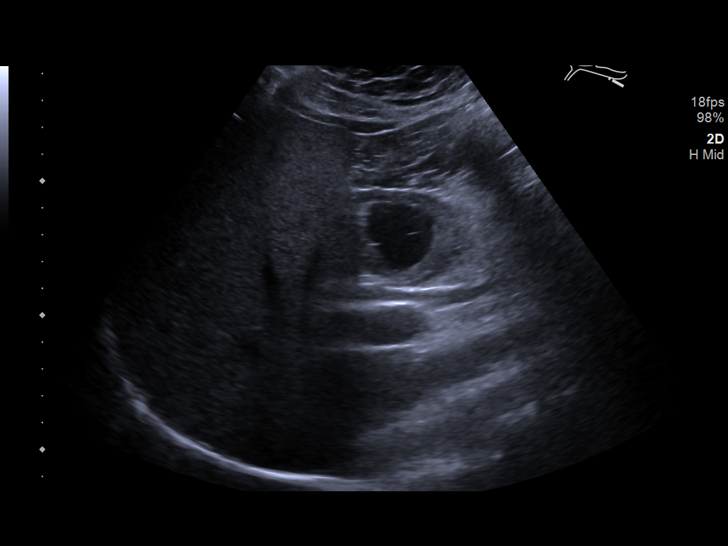
[im 5/25]
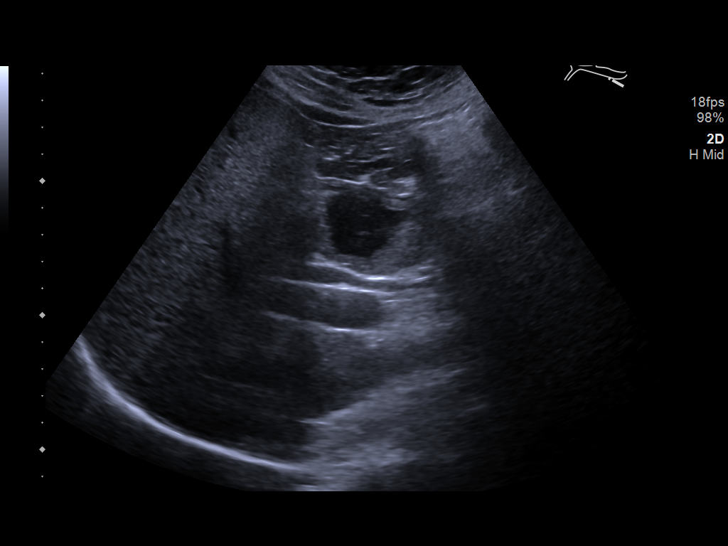
[im 7/25]
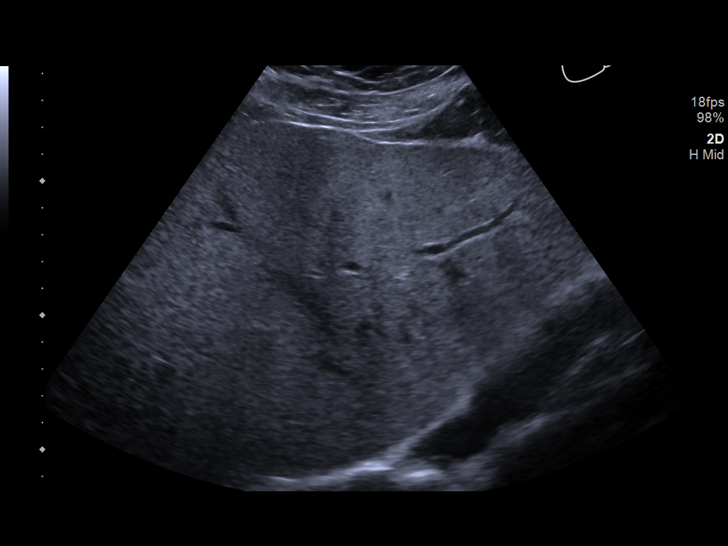
[im 9/25]
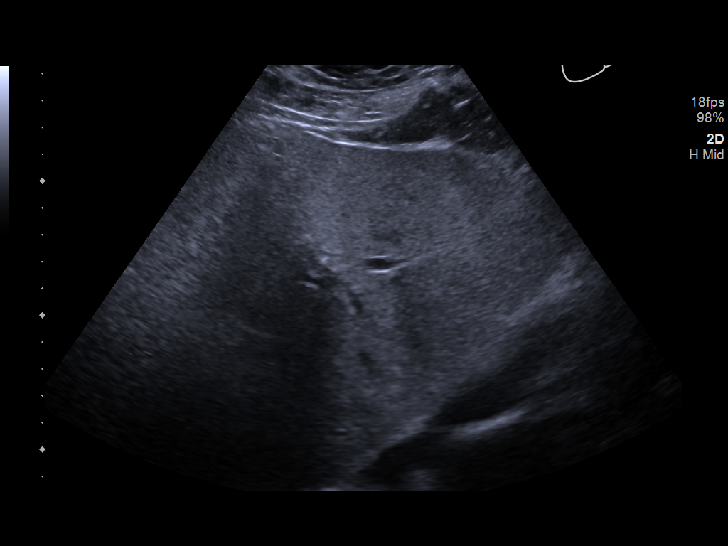
[im 10/25]
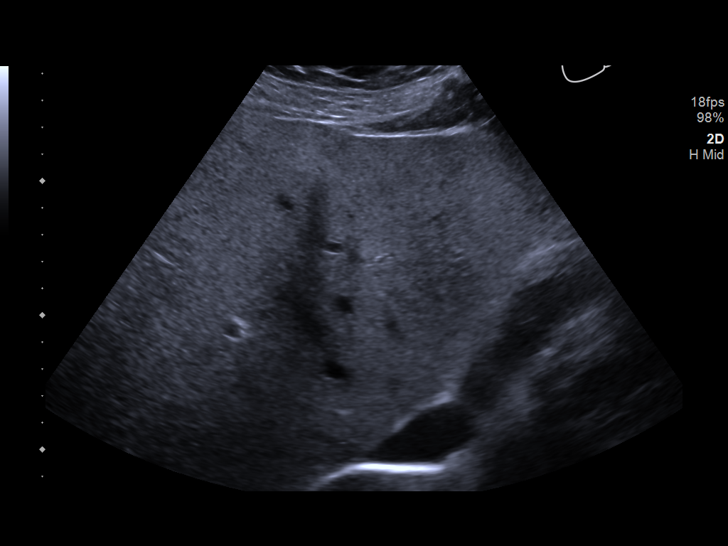
[im 12/25]
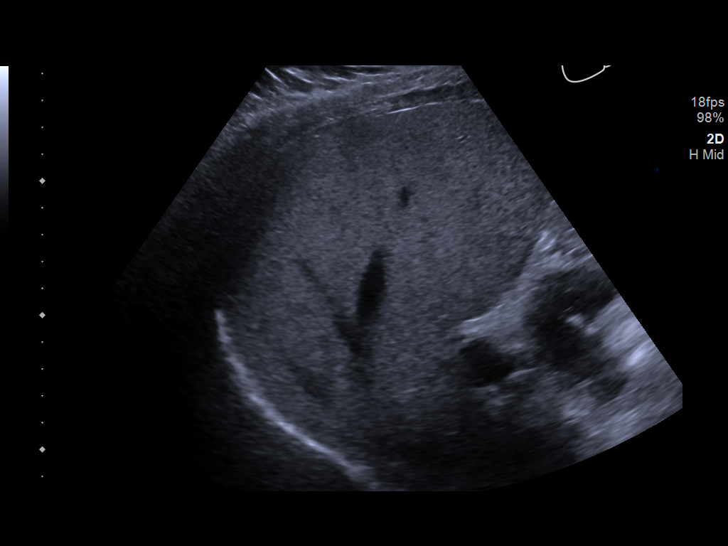
[im 14/25]
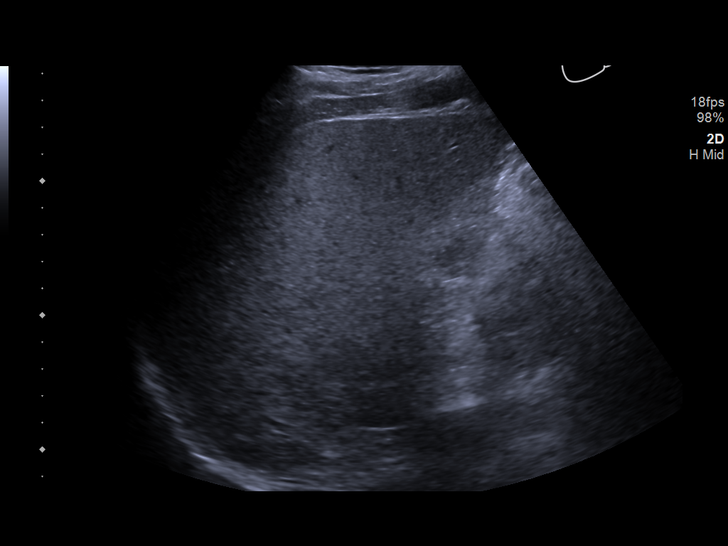
[im 16/25]
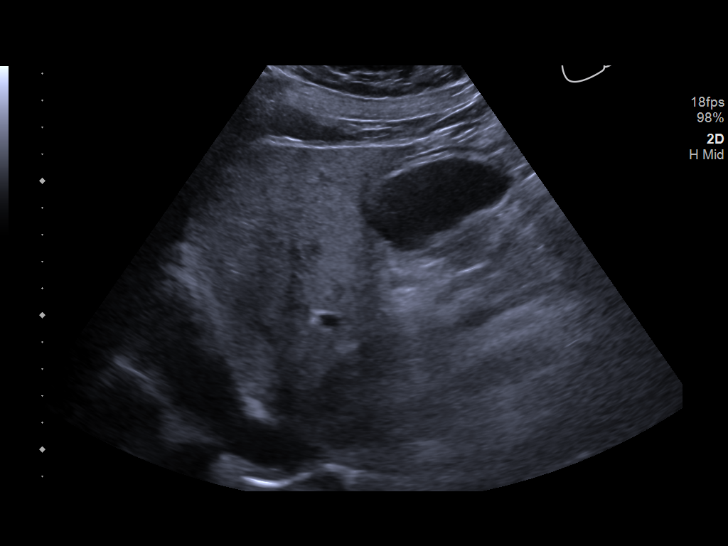
[im 17/25]
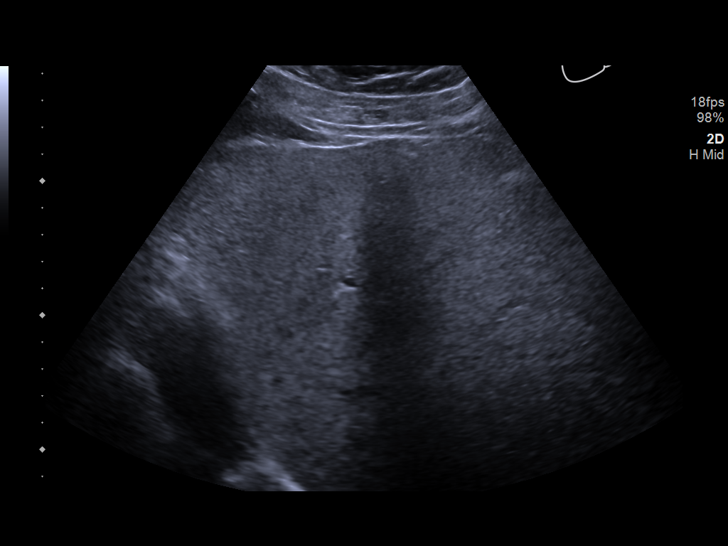
[im 19/25]
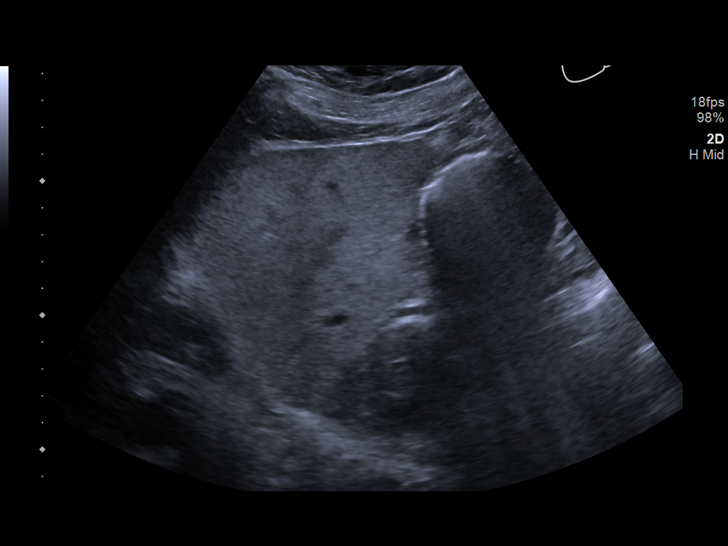
[im 21/25]
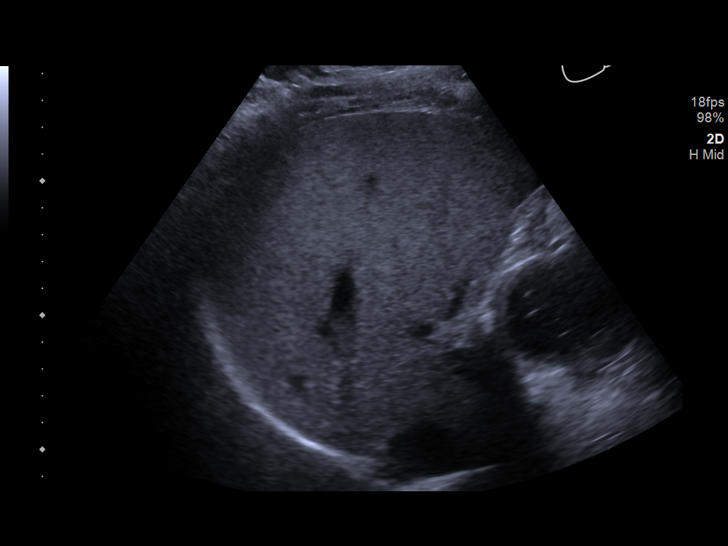
[im 23/25]
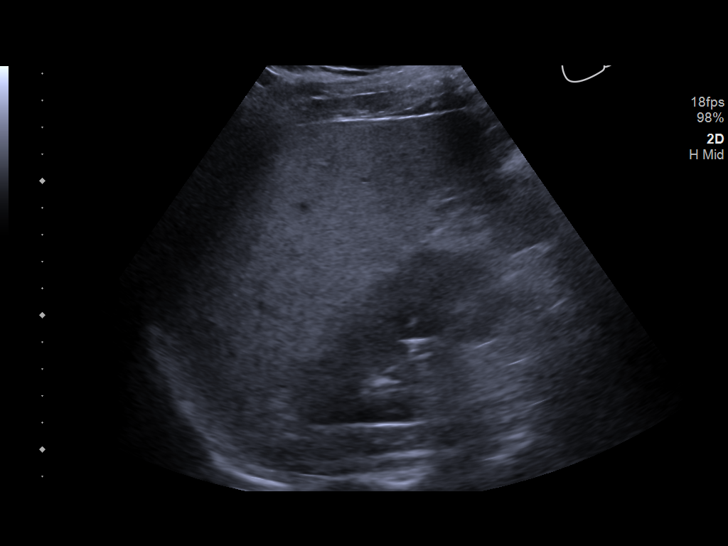
[im 25/25]
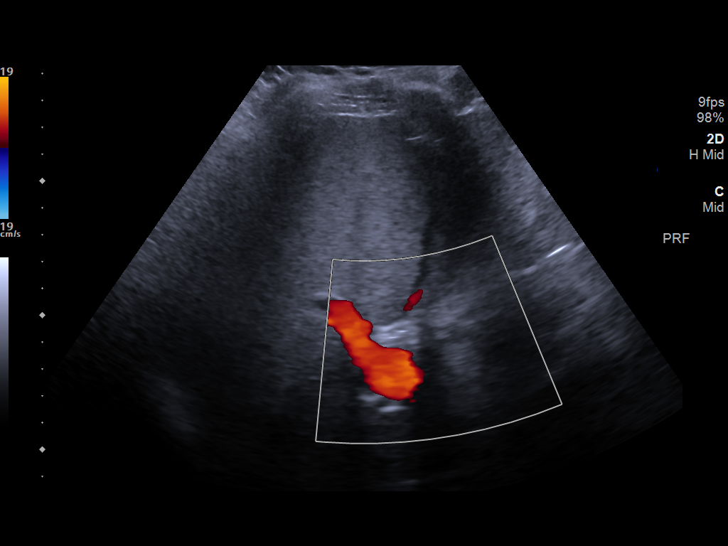

[14 of 25 positions shown; findings below may reference images not displayed]

FINDINGS: Gallbladder:

Surgically absent.

Common bile duct:

Diameter: 6 millimeters, upper limits of normal.

Liver:

No intrahepatic biliary ductal dilatation. Echogenic liver (image
25). No discrete liver lesion. Portal vein is patent on color
Doppler imaging with normal direction of blood flow towards the
liver.

Other findings: Negative visible right kidney. No free fluid. The
duodenum which appeared abnormal on the earlier CT is not evaluated.
IMPRESSION: 1. No evidence of biliary obstruction. Surgically absent
gallbladder.
2. Hepatic steatosis.
3. The duodenum which appeared abnormal on the earlier CT is not
evaluated by ultrasound.

## 2020-04-16 IMAGING — CT CT ABD-PELV W/ CM
2 of 5 series · 16 of 46 positions shown, 18 images · IV contrast (omnipaque)
Comparison: No priors.

CLINICAL DATA: 57-year-old male with history of mid to upper
abdominal pain and right-sided rib pain with nausea and vomiting for
the past 6 days.

EXAM:
CT ABDOMEN AND PELVIS WITH CONTRAST
TECHNIQUE: Multidetector CT imaging of the abdomen and pelvis was performed
using the standard protocol following bolus administration of
intravenous contrast.
CONTRAST:  100mL OMNIPAQUE IOHEXOL 300 MG/ML  SOLN

[Series 3: a/p w/ 5mm · axial · 0.81mm/px · z∈[+798,+1248]mm · 13 of 102 slices shown, 15 images]
[im 6/102  soft-tissue]
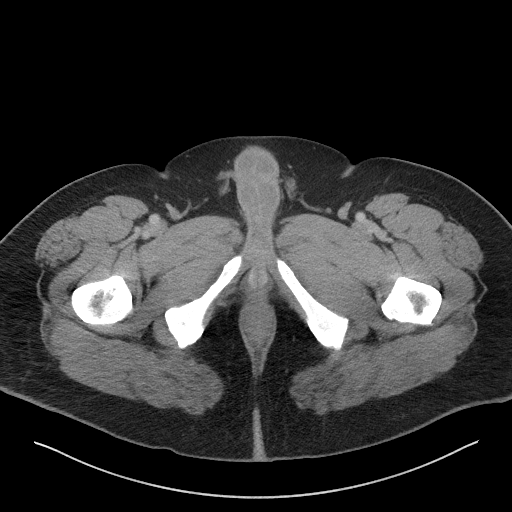
[im 6/102  bone]
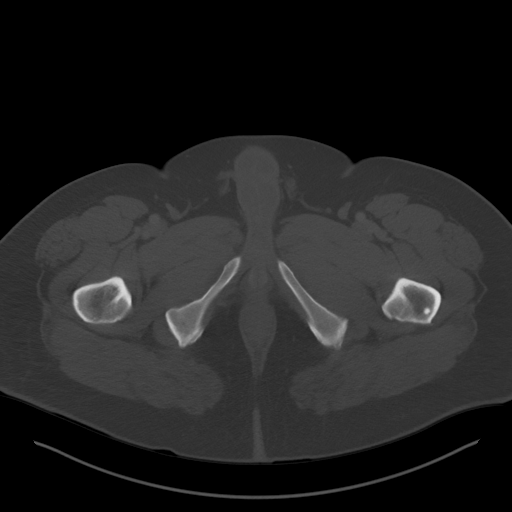
[im 16/102  soft-tissue]
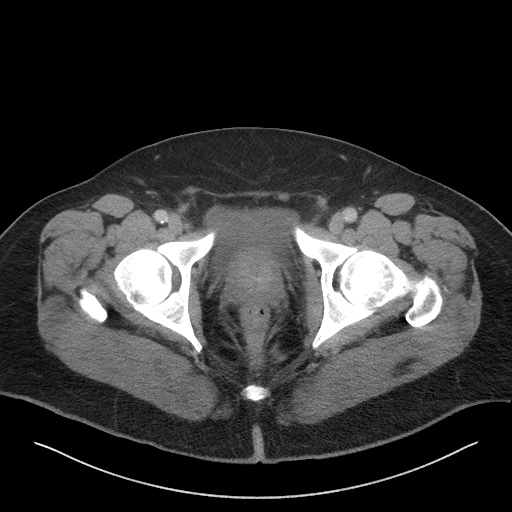
[im 22/102  soft-tissue]
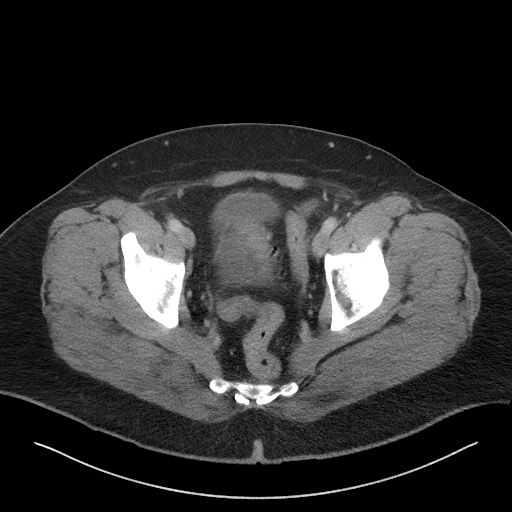
[im 27/102  soft-tissue]
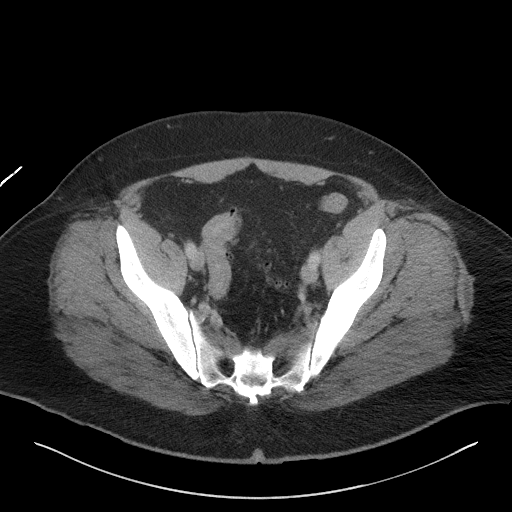
[im 38/102  soft-tissue]
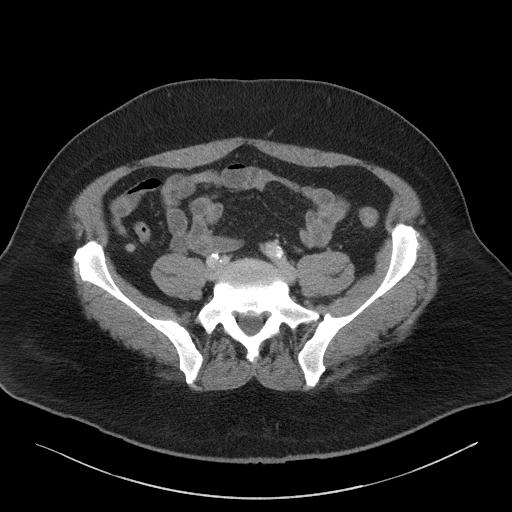
[im 43/102  soft-tissue]
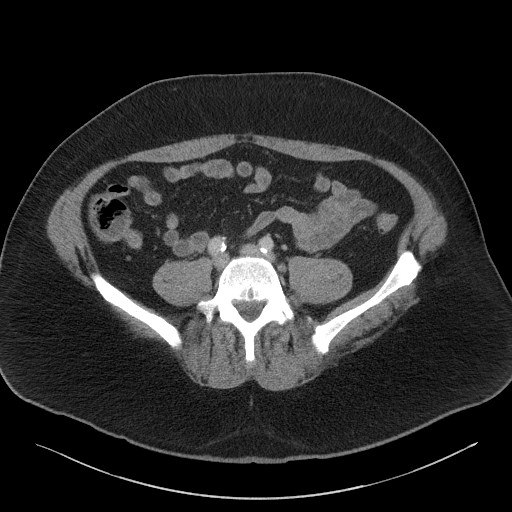
[im 54/102  soft-tissue]
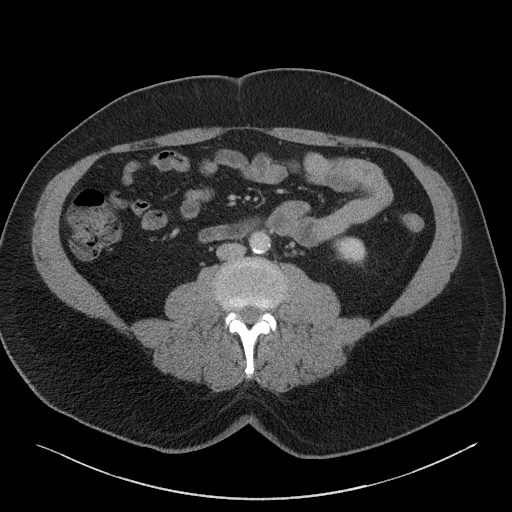
[im 59/102  soft-tissue]
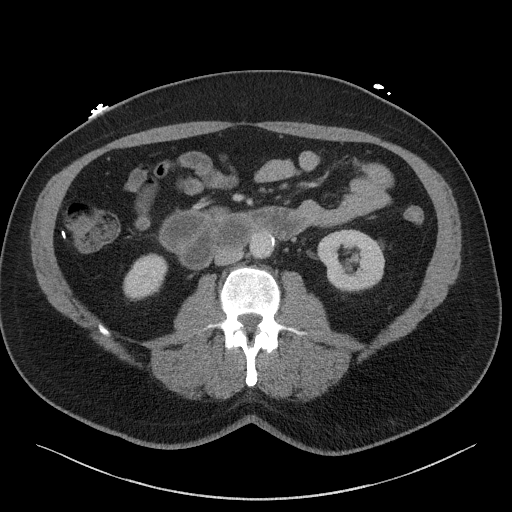
[im 64/102  soft-tissue]
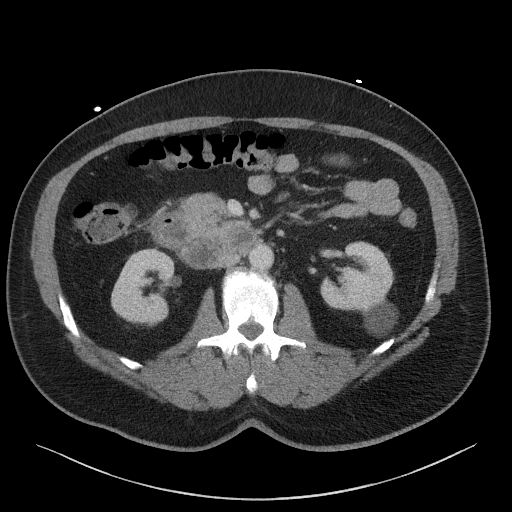
[im 64/102  bone]
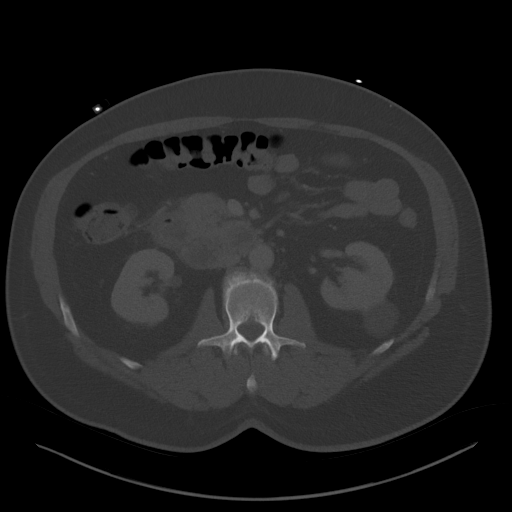
[im 75/102  soft-tissue]
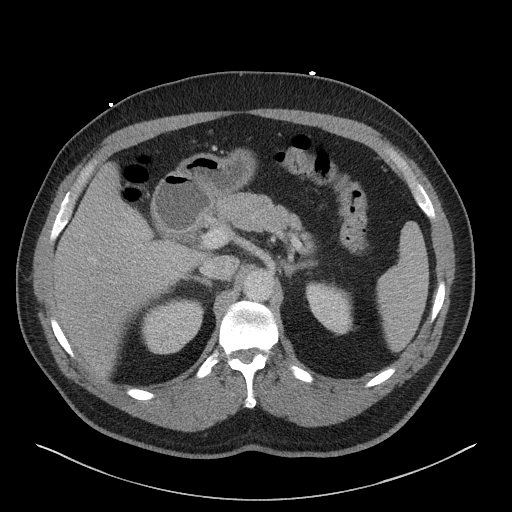
[im 80/102  soft-tissue]
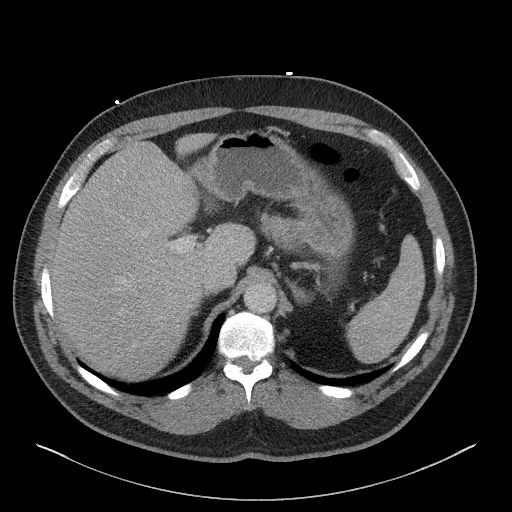
[im 86/102  soft-tissue]
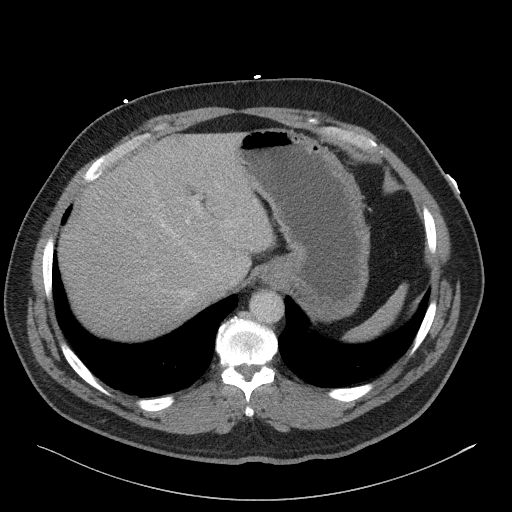
[im 96/102  soft-tissue]
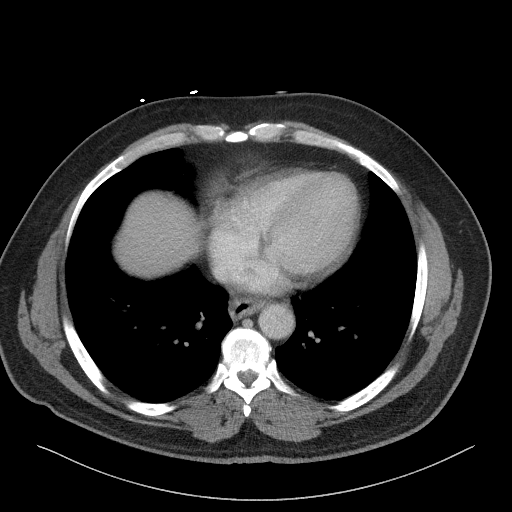

[Series 6: a/p w/ cor · coronal · 0.88mm/px · 3 of 182 slices shown]
[im 61/182  soft-tissue]
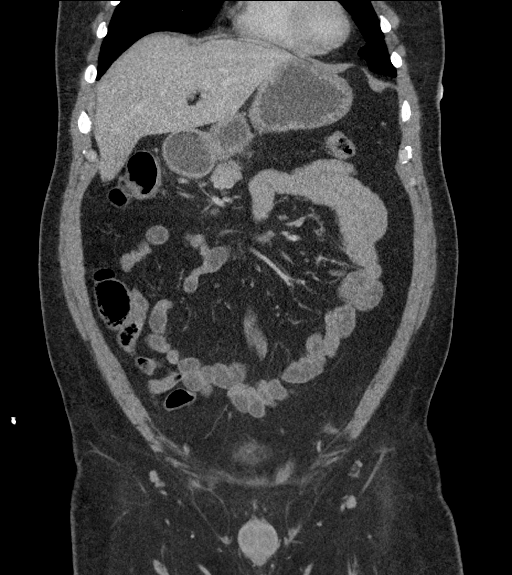
[im 81/182  soft-tissue]
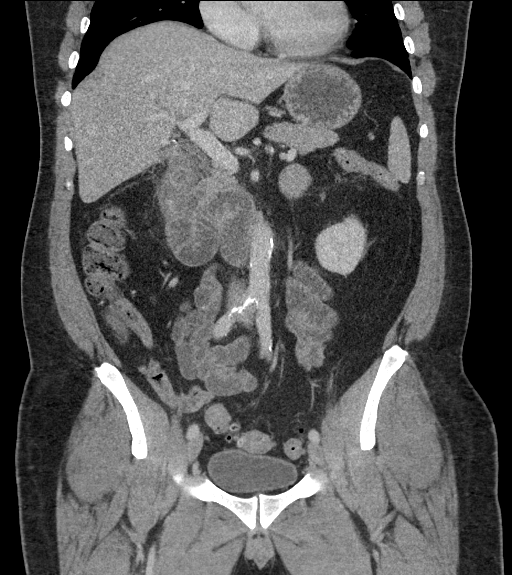
[im 101/182  soft-tissue]
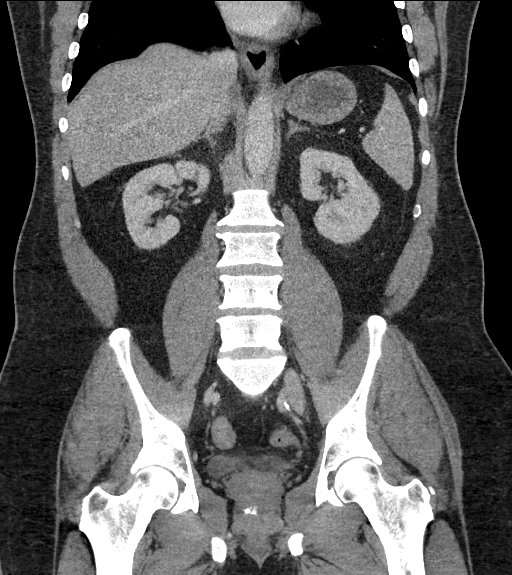

[16 of 46 positions shown; findings below may reference images not displayed]

FINDINGS: Lower chest: Unremarkable.

Hepatobiliary: No suspicious cystic or solid hepatic lesions. No
intra or extrahepatic biliary ductal dilatation. Status post
cholecystectomy.

Pancreas: No pancreatic mass. No pancreatic ductal dilatation. No
pancreatic or peripancreatic fluid or inflammatory changes.

Spleen: Unremarkable.

Adrenals/Urinary Tract: 3.8 cm low-attenuation lesion in the
interpolar region of the left kidney is compatible with a simple
cyst. Right kidney and bilateral adrenal glands are normal in
appearance. No hydroureteronephrosis. Urinary bladder is normal in
appearance.

Stomach/Bowel: Normal appearance of the stomach. Severe mural
thickening in the duodenum, particularly in the region of the
duodenal bulb and second portion of the duodenum. On axial image 33
of series 3 and coronal image 81 of series 6 there appears to be a
small focal outpouching from the posterior aspect of the duodenal
bulb and proximal second portion of the duodenum, highly concerning
for ulcer. Subtle surrounding inflammatory changes in the adjacent
retroperitoneal fat. No pathologic dilatation of distal portions of
small bowel or colon. A few colonic diverticulae are noted,
particularly in the sigmoid colon, without surrounding inflammatory
changes to suggest an acute diverticulitis at this time. Normal
appendix.

Vascular/Lymphatic: Aortic atherosclerosis, without evidence of
aneurysm or dissection in the abdominal or pelvic vasculature. No
lymphadenopathy noted in the abdomen or pelvis.

Reproductive: Prostate gland and seminal vesicles are unremarkable
in appearance.

Other: No significant volume of ascites.  No pneumoperitoneum.

Musculoskeletal: There are no aggressive appearing lytic or blastic
lesions noted in the visualized portions of the skeleton.
IMPRESSION: 1. Findings are highly concerning for duodenitis with probable
duodenal ulcer involving the posterior aspect of the duodenal bulb
and proximal second portion of the duodenum. Further evaluation by
Gastroenterology is strongly recommended.
2. Colonic diverticulosis without evidence of acute diverticulitis
at this time.
3. Aortic atherosclerosis.

## 2020-04-16 IMAGING — CR DG CHEST 2V
2 series · 2 of 2 positions shown · non-contrast
Comparison: None.

CLINICAL DATA: Right rib pain.  20 pound weight loss for 2 weeks.

EXAM:
CHEST - 2 VIEW

[chest lat]
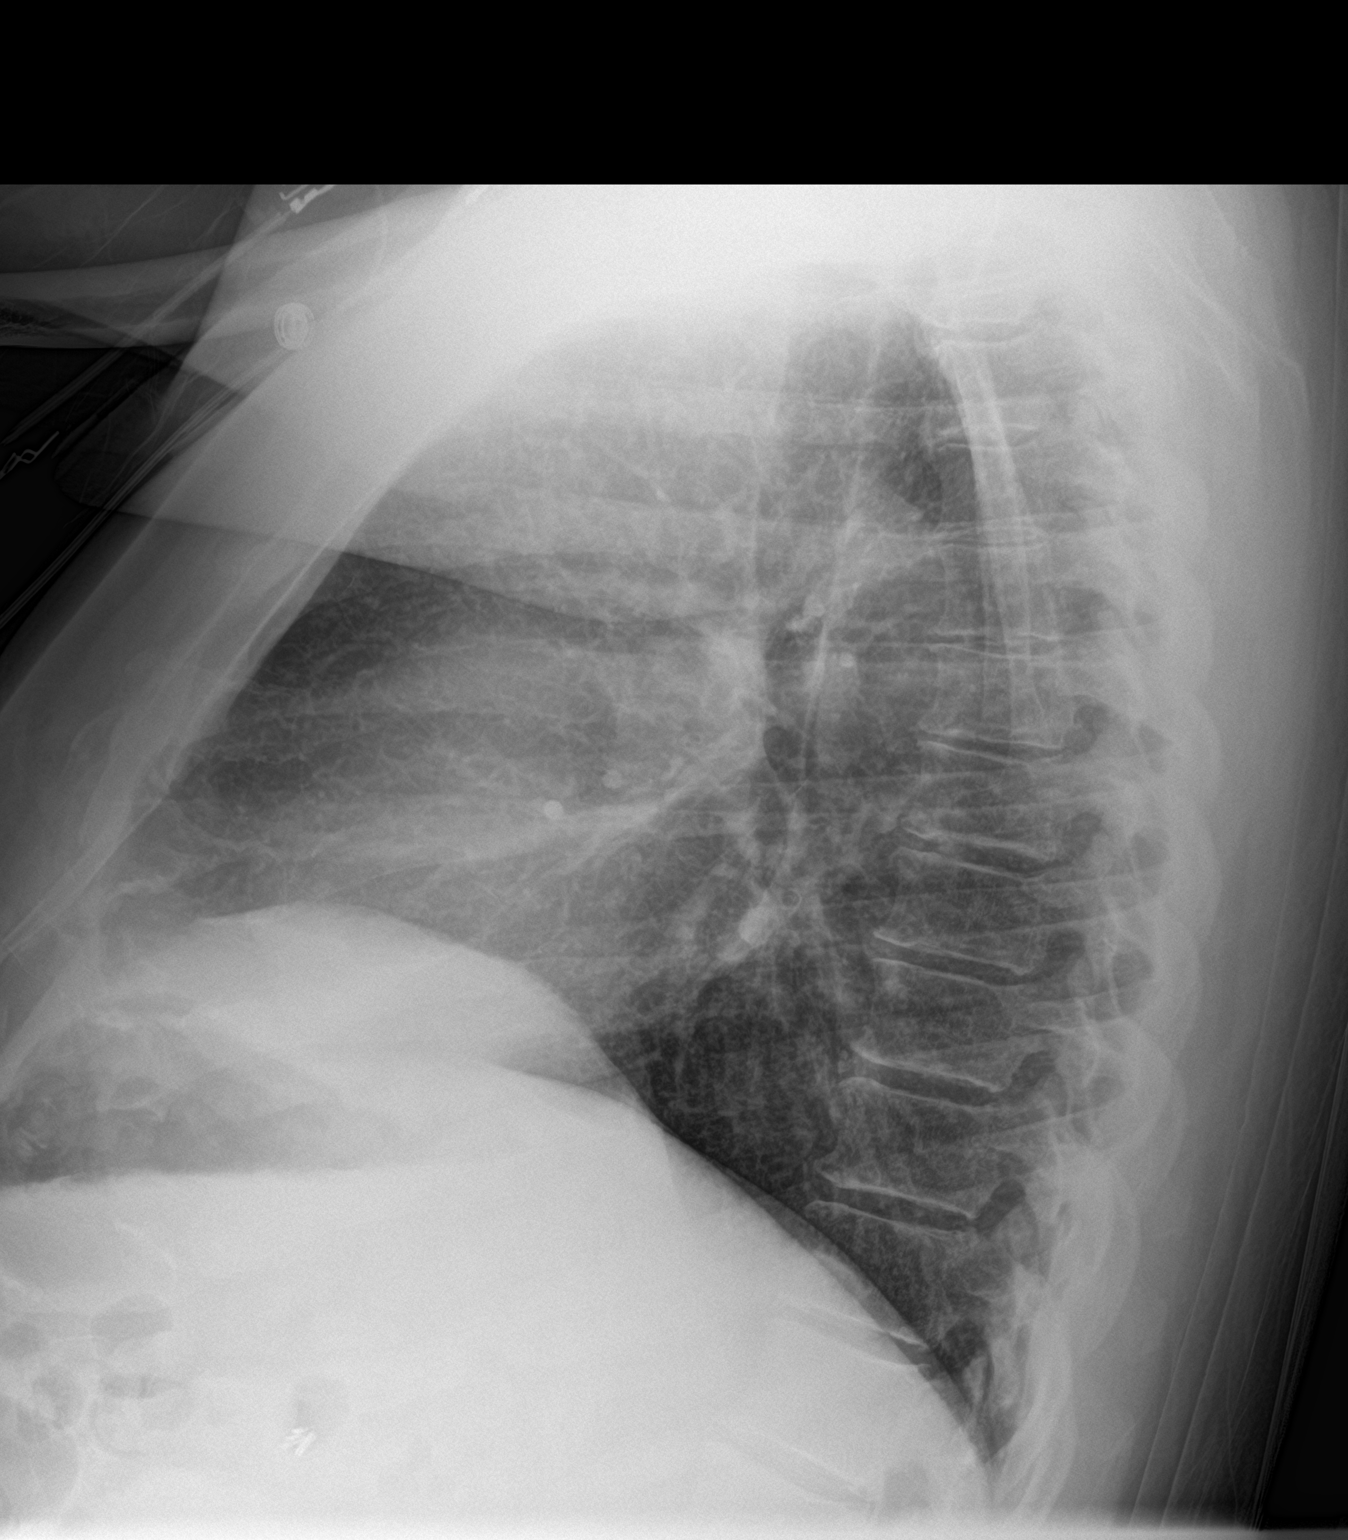

[chest ap]
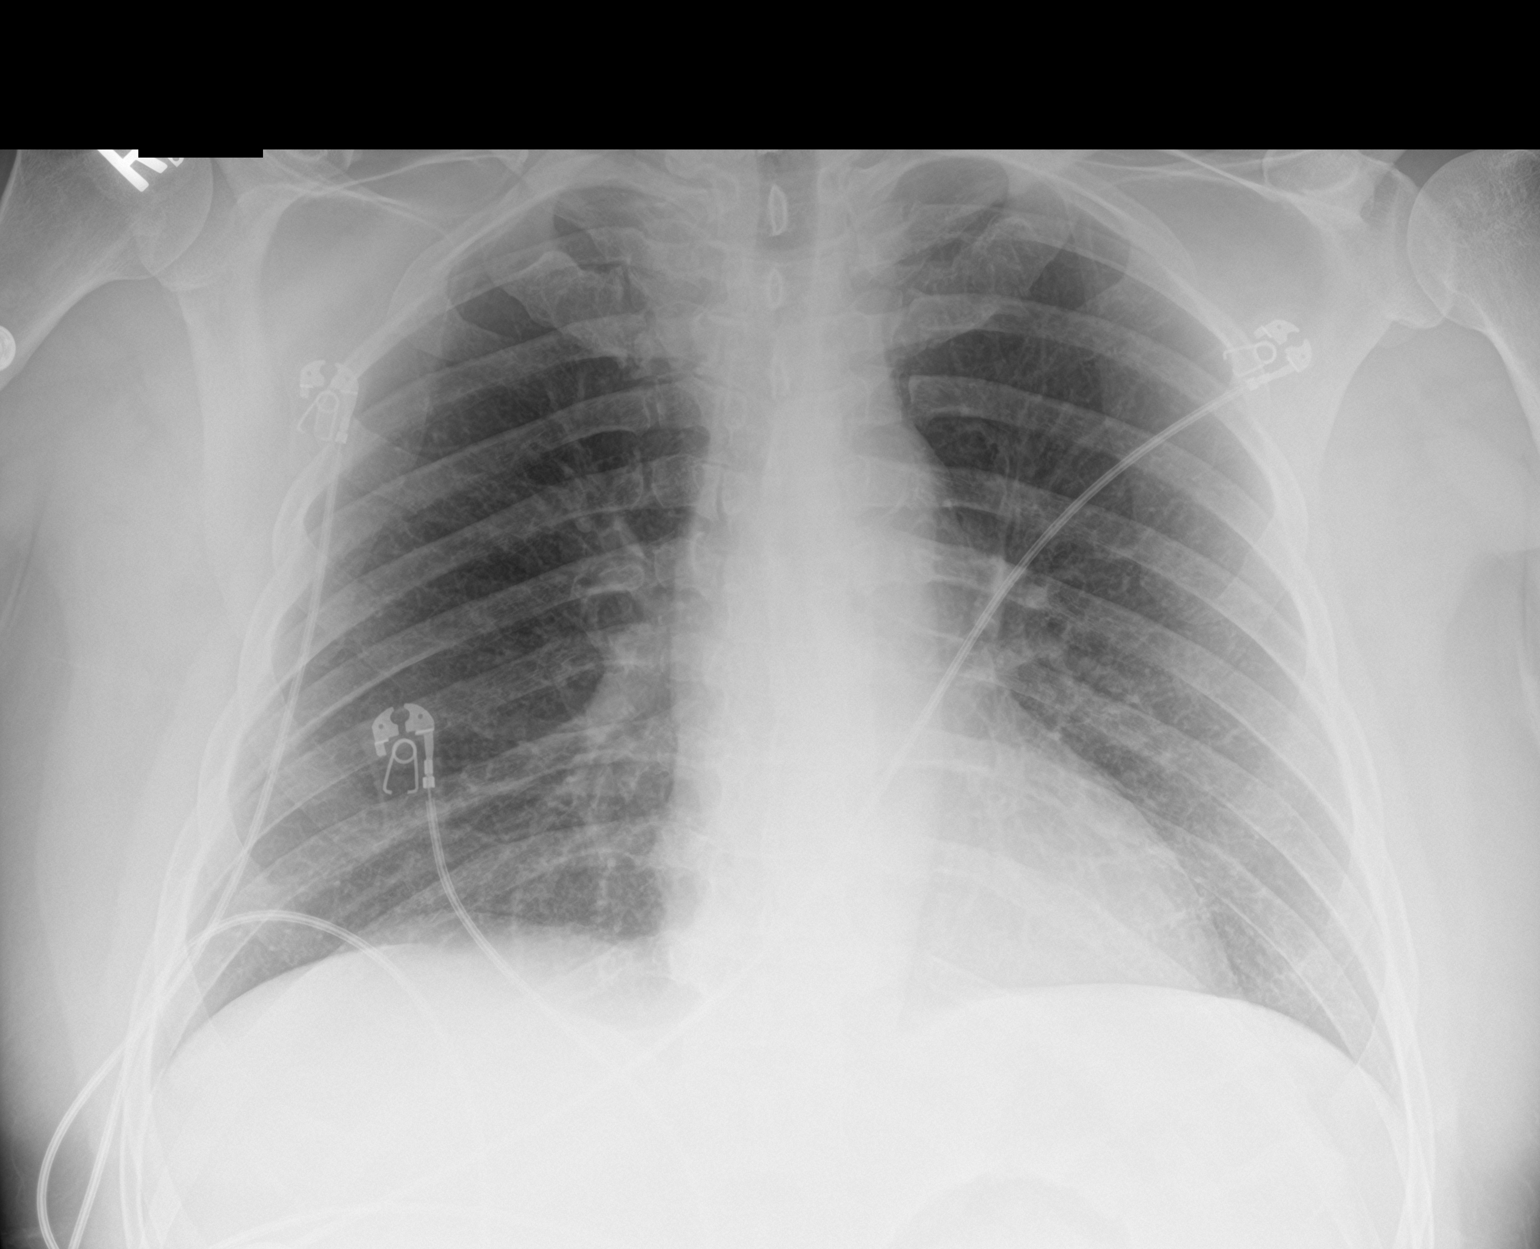

[2 of 2 positions shown; findings below may reference images not displayed]

FINDINGS: The heart size and mediastinal contours are within normal limits.
Both lungs are clear. The visualized skeletal structures are
unremarkable. Cholecystectomy clips.
IMPRESSION: Negative chest.

## 2020-07-05 ENCOUNTER — Other Ambulatory Visit: Payer: Self-pay | Admitting: Internal Medicine

## 2021-05-22 ENCOUNTER — Other Ambulatory Visit: Payer: Self-pay | Admitting: Internal Medicine

## 2022-02-17 ENCOUNTER — Other Ambulatory Visit: Payer: Self-pay | Admitting: Internal Medicine

## 2022-04-22 ENCOUNTER — Other Ambulatory Visit: Payer: Self-pay | Admitting: Internal Medicine

## 2022-05-12 ENCOUNTER — Ambulatory Visit: Payer: BC Managed Care – PPO | Attending: Cardiology | Admitting: Cardiology

## 2022-05-12 ENCOUNTER — Encounter: Payer: Self-pay | Admitting: Cardiology

## 2022-05-12 VITALS — BP 152/90 | HR 66 | Ht 73.0 in | Wt 267.8 lb

## 2022-05-12 DIAGNOSIS — I10 Essential (primary) hypertension: Secondary | ICD-10-CM | POA: Diagnosis not present

## 2022-05-12 DIAGNOSIS — E668 Other obesity: Secondary | ICD-10-CM

## 2022-05-12 DIAGNOSIS — Z8249 Family history of ischemic heart disease and other diseases of the circulatory system: Secondary | ICD-10-CM

## 2022-05-12 DIAGNOSIS — E1169 Type 2 diabetes mellitus with other specified complication: Secondary | ICD-10-CM | POA: Insufficient documentation

## 2022-05-12 DIAGNOSIS — E8881 Metabolic syndrome: Secondary | ICD-10-CM | POA: Diagnosis not present

## 2022-05-12 DIAGNOSIS — E7849 Other hyperlipidemia: Secondary | ICD-10-CM | POA: Diagnosis not present

## 2022-05-12 NOTE — Patient Instructions (Addendum)
Medication Instructions:   No changes  *If you need a refill on your cardiac medications before your next appointment, please call your pharmacy*   Lab Work: second week in Dec 2023  [place], [place] [address]  Or [place]  [address], [place], [place] If you have labs (blood work) drawn today and your tests are completely normal, you will receive your results only by: [place] (if you have MyChart) OR A paper copy in the mail If you have any lab test that is abnormal or we need to change your treatment, we will call you to review the results.   Testing/Procedures:  Not needed  Follow-Up: At [facility], you and your health needs are our priority.  As part of our continuing mission to provide you with exceptional heart care, we have created designated Provider Care Teams.  These Care Teams include your primary Cardiologist (physician) and Advanced Practice Providers (APPs -  Physician Assistants and Nurse Practitioners) who all work together to provide you with the care you need, when you need it.     Your next appointment:   1 month(s)  The format for your next appointment:   In Person  Provider:   Glenetta Hew, MD

## 2022-05-12 NOTE — Progress Notes (Signed)
Primary Care Provider: Tempie Hoist, [place] Cardiologist: Glenetta Hew, MD Electrophysiologist: None  Clinic Note: Chief Complaint  Patient presents with   Establish Care    To discuss baseline cardiovascular risk   New Patient (Initial Visit)    Hypertension    ===================================  ASSESSMENT/PLAN   Problem List Items Addressed This Visit       Cardiology Problems   Essential hypertension (Chronic)    Blood pressure today is quite high.  He says his been in the 170s berry.  Therefore he went to have the diagnosis of hypertension.  Per guidelines, recommendation would be to allow for 3 to 6 months of lifestyle modification.  He would like to some more time before we consider initiating therapy.  I will see him back after recheck his labs.      Relevant Orders   EKG 12-Lead (Completed)   Lipid panel   Comprehensive metabolic panel   Hemoglobin A1c   Hyperlipidemia due to dietary fat intake (Chronic)    Borderline diabetic with A1c of 6.4, but not correlate.  He is morbidly obese.  He has been guilty of dietary discretion per his own admission. Most recent labs were drawn in February of this year.  He has subsequently gone into a significant diet and exercise program with weight loss.  We can reassess next month to see how far he is improved.  For metabolic syndrome, target LDL should be at least less than 100, however based on results would potentially consider Coronary Calcium Score, which would help direct therapy further.      Relevant Orders   EKG 12-Lead (Completed)   Lipid panel   Comprehensive metabolic panel   Hemoglobin A1c     Other   Family history of premature coronary artery disease (Chronic)    He really does not have any family history of premature cardiac disease.  His father is pretty old when he had his MI and CABG.  Despite this he is concerned because he knows that he let her self get away.  Anticipate  potentially checking a coronary calcium score and even potentially a Coronary CTA in follow-up depending on his labs.      Relevant Orders   EKG 12-Lead (Completed)   Lipid panel   Comprehensive metabolic panel   Hemoglobin I1W   Metabolic syndrome - Primary (Chronic)    I explained to him the concept of metabolic syndrome. Obesity, hypertension, high triglycerides, low LDL, hyperglycemia-he meets all 5 criteria.  He is very reluctant to being on medications although he does acknowledge he probably will end up being on meds.  He wants to give himself a chance to see if getting his exercise regimen back in place as well as adjusting his diet.  His goal is to lose another 15 to 20 pounds in the next 6 months or so.  We will reassess labs including lipid panel, A1c and CMP in December in order to give a little bit more time), since his last labs were from February.  Based on these labs and follow-up BP check, will determine the need to initiate medications for BP, lipids and even potentially diabetes.      Relevant Orders   EKG 12-Lead (Completed)   Lipid panel   Comprehensive metabolic panel   Hemoglobin A1c   Moderate obesity (Chronic)    BMI is 35 so is not quite morbidly obese, but is working on weight loss.  Plan to reassess in  a few months.      Relevant Orders   EKG 12-Lead (Completed)   Lipid panel   Comprehensive metabolic panel   Hemoglobin A1c    ===================================  HPI:    Caleb Mason is a 61 y.o. male who is being seen today for the evaluation of Elevated Blood Pressure Reading without Diagnosis of Hypertension at the request of Tempie Hoist, FNP.  Mavryk Luiz was last seen on February 2022 by Dellia Nims, FNP his blood pressure was 139/83.  He weighed 262 pounds.  He was on pantoprazole and vitamin D but otherwise doing well.  Labs reviewed below.  Cardiology consult placed because of concerns for high blood pressure and baseline  cardiovascular risk.  Per patient choice.  Recent Hospitalizations: None  Reviewed  CV studies:    The following studies were reviewed today: (if available, images/films reviewed: From Epic Chart or Care Everywhere) None:  Interval History:   Caleb Mason presents here today for cardiology evaluation.  He said that pretty much from the February visit he had gotten himself out of control.  He been eating poorly and getting poor sleep.  Working hard and not pay attention to his doing.  He is he was no longer active or exercising.  He started noticing blood pressures up in the 170 mmHg range and was getting headaches and feeling bad.  He denies any chest pain or pressure but just had headaches and feeling tired and weak.  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  The last 2 months he has gotten quite serious about taking care of himself, concerned because of these headaches.  He has made significant adjustments to his diet, cut out fast foods and sweets etc.  He said he lost about 15 pounds since starting this.  He also said that his blood pressures have come down significantly. He is very leery of taking medications and is hoping he can take care of this on self, but is concerned since he does have family history of CAD now with his father having CAD at age 76 and recently passed away.  This caused him to take a second look at himself.  Currently doing fairly well.  No longer having headaches or dizziness or fatigue.  With increased level of exercise about 7 days a week for about half an hour at least of walking or other exercise, he is feeling much better.  No chest pain or pressure with rest or exertion.  No PND, orthopnea or edema.  No syncope or near syncope.  No TIA or amaurosis fugax.  CV Review of Symptoms (Summary): no chest pain or dyspnea on exertion positive for - wgt loss - with improvement in BP & fatigue negative for -  edema, irregular heartbeat, loss of consciousness, orthopnea, palpitations, paroxysmal nocturnal dyspnea, rapid heart rate, shortness of breath, or near syncope, TIA/amaurosis fugax, claudication.   REVIEWED OF SYSTEMS   Review of Systems  Constitutional:  Positive for weight loss (According to him he is lost 15 pounds since starting his trend.). Negative for malaise/fatigue (No longer feeling tired and worn out now that he is starting exercise and losing weight.).  HENT:  Positive for congestion (Allergies).   Respiratory:  Positive for cough (Dry hacking cough from allergies.). Negative for shortness of breath and wheezing (Only when his allergies act up).   Gastrointestinal:  Negative for blood in stool and melena.  Genitourinary:  Negative for dysuria and hematuria.  Musculoskeletal:  Positive for joint pain (Mild arthritis pains but doing better).  Neurological:  Negative for dizziness, focal weakness and loss of consciousness.       No longer noting headaches or dizziness now that his blood pressure is doing better.  Also energy level better.  Endo/Heme/Allergies:  Positive for environmental allergies.  Psychiatric/Behavioral: Negative.     I have reviewed and (if needed) personally updated the patient's problem list, medications, allergies, past medical and surgical history, social and family history.   PAST MEDICAL HISTORY   Past Medical History:  Diagnosis Date   Adenomatous colon polyp 2013   68m, tubular   Allergy    seasonal   Anemia    Broken hip ([place]   Chronic duodenal ulcer with gastric outlet obstruction 021/19/4174  Complication of anesthesia    got sick    Diverticulosis    Essential hypertension    At night for been given official diagnosis.   GERD (gastroesophageal reflux disease)    Impaired glucose regulation    Injury    Broken hip   Kidney stones    Lumbar back pain 2011   had hip fracture that caused lower back problems => has troubles with  sciatica   Pancreatitis    PONV (postoperative nausea and vomiting)    Vitamin D deficiency     PAST SURGICAL HISTORY   Past Surgical History:  Procedure Laterality Date   BIOPSY  09/10/2018   Procedure: BIOPSY;  Surgeon: CLavena Bullion DO;  [facility];  Service: Endoscopy;;   CHOLECYSTECTOMY  2008   had pancreatitis   COLONOSCOPY W/ POLYPECTOMY     CYSTOSCOPY WITH RETROGRADE PYELOGRAM, URETEROSCOPY AND STENT PLACEMENT Right 09/15/2012   Procedure: CYSTOSCOPY WITH right  RETROGRADE PYELOGRAM, right  URETEROSCOPY AND STENT PLACEMENT;  Surgeon: DBernestine Amass MD;  [facility]  Service: Urology;  Laterality: Right;   ESOPHAGOGASTRODUODENOSCOPY (EGD) WITH PROPOFOL N/A 09/10/2018   Procedure: ESOPHAGOGASTRODUODENOSCOPY (EGD) WITH PROPOFOL;  Surgeon: CLavena Bullion DO;  [facility];  Service: Endoscopy;  Laterality: N/A;   HOLMIUM LASER APPLICATION Right 30/81/4481  Procedure: HOLMIUM LASER APPLICATION;  Surgeon: DBernestine Amass MD;  [facility]  Service: Urology;  Laterality: Right;   KNEE ARTHROSCOPY Right 1993   right    Immunization History  Administered Date(s) Administered   Moderna Sars-Covid-2 Vaccination 07/18/2020, 08/15/2020    MEDICATIONS/ALLERGIES   Current Meds  Medication Sig   cholecalciferol (VITAMIN D3) 25 MCG (1000 UNIT) tablet Take 1,000 Units by mouth once a week.   pantoprazole (PROTONIX) 40 MG tablet TAKE 1 TABLET BY MOUTH ABOUT 30 MINUTES BEFORE BREAKFAST    No Known Allergies  SOCIAL HISTORY/FAMILY HISTORY   Reviewed in Epic:   Social History   Tobacco Use   Smoking status: Former    Packs/day: 1.00    Years: 30.00    Total pack years: 30.00    Types: Cigarettes    Quit date: 09/15/2007    Years since quitting: 14.6   Smokeless tobacco: Never  Vaping Use   Vaping Use: Never used  Substance Use Topics   Alcohol use: No    Alcohol/week: 0.0 standard drinks of  alcohol   Drug use: No   Social History   Social History Narrative   Married with 2 sons; lives with his wife.  Identifies himself as a male.   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      As of May 12, 2022: Exercises roughly 7 days a week for least an hour.   Has given up fast food, eating much better.  Giving up sweets.  Getting better sleep.  Trying to work better hours. => Has lost 15 pounds.  Also noted improvement in blood pressure from the 170s to 150/90 today.         Family History  Problem Relation Age of Onset   Heart disease Mother        Does not know the details.   Esophageal cancer Father 37       died as a result - Dx'd post-CABG   Coronary artery disease Father 51       cabg   Heart attack Paternal Grandfather 71       died   Colon cancer Neg Hx    Pancreatic cancer Neg Hx    Stomach cancer Neg Hx    Liver disease Neg Hx    Colon polyps Neg Hx     OBJCTIVE -PE, EKG, labs   Wt Readings from Last 3 Encounters:  05/12/22 267 lb 12.8 oz (121.5 kg)  12/13/18 255 lb (115.7 kg)  11/30/18 260 lb (117.9 kg)    Physical Exam: BP (!) 152/90 (BP Location: Left Arm, Patient Position: Sitting)   Pulse 66   Ht '6\' 1"'$  (1.854 m)   Wt 267 lb 12.8 oz (121.5 kg)   SpO2 97%   BMI 35.33 kg/m  Physical Exam Vitals reviewed.  Constitutional:      General: He is not in acute distress.    Appearance: Normal appearance. He is obese. He is not ill-appearing or toxic-appearing.  HENT:     Head: Normocephalic and atraumatic.  Neck:     Vascular: No carotid bruit or JVD.  Cardiovascular:     Rate and Rhythm: Normal rate and regular rhythm. No extrasystoles are present.    Chest Wall: PMI is not displaced.     Pulses: Decreased pulses (Difficult to palpate due to body habitus, but  warm feet.).     Heart sounds: S1 normal and S2 normal. Heart sounds are distant. No murmur heard.    No friction rub. No gallop.  Pulmonary:     Effort: Pulmonary effort is normal. No respiratory distress.     Breath sounds: Normal breath sounds. No wheezing, rhonchi or rales.  Chest:     Chest wall: No tenderness.  Abdominal:     General: Bowel sounds are normal. There is no distension.     Palpations: Abdomen is soft.     Comments: Obese but soft, NT/ND/NABS.  No HSM.  Musculoskeletal:        General: Swelling (Trivial puffy swelling) present.     Cervical back: Normal range of motion and neck supple.  Skin:    General: Skin is warm and dry.  Neurological:     General: No focal deficit present.     Mental Status: He is alert and oriented to person, place, and time.     Gait: Gait normal.  Psychiatric:        Mood and Affect: Mood normal.        Behavior: Behavior normal.  Thought Content: Thought content normal.        Judgment: Judgment normal.     Comments: Seems to have a significant opinion     Adult ECG Report  Rate: 66 ;  Rhythm: normal sinus rhythm and artifact makes it difficult to assess.  Nonspecific ST and T wave changes. ;   Narrative Interpretation: Borderline  Recent Labs: 08/14/2021 Na+ 138, K+ 4.4, Cl- 107, HCO3- 29, BUN 18, Cr 0.92, Glu 118, Ca2+ 9.2; AST 19, ALT 39, AlkP 71 CBC: W 6.46, H/H 14.4/44.2, Plt 260 TC 203, TG 286, HDL 35, LDL 111; A1c 6.4.  ================================================== I spent a total of 30 minutes with the patient spent in direct patient consultation.  Additional time spent with chart review  / charting (studies, outside notes, etc): 25 min Total Time: 55 min  Current medicines are reviewed at length with the patient today.  (+/- concerns) n/a  Notice: This dictation was prepared with Dragon dictation along with smart phrase technology. Any transcriptional errors that result from this process are unintentional and  may not be corrected upon review.  Studies Ordered:  Orders Placed This Encounter  Procedures   Lipid panel   Comprehensive metabolic panel   Hemoglobin A1c   EKG 12-Lead   No orders of the defined types were placed in this encounter.   Patient Instructions / Medication Changes & Studies & Tests Ordered   Patient Instructions  Medication Instructions:   No changes  *If you need a refill on your cardiac medications before your next appointment, please call your pharmacy*   Lab Work: second week in Dec 2023  [place], [place] [address]  Or [facility]  [address], [place], [place] If you have labs (blood work) drawn today and your tests are completely normal, you will receive your results only by: [employer] (if you have MyChart) OR A paper copy in the mail If you have any lab test that is abnormal or we need to change your treatment, we will call you to review the results.   Testing/Procedures:  Not needed  Follow-Up: At [facility], you and your health needs are our priority.  As part of our continuing mission to provide you with exceptional heart care, we have created designated Provider Care Teams.  These Care Teams include your primary Cardiologist (physician) and Advanced Practice Providers (APPs -  Physician Assistants and Nurse Practitioners) who all work together to provide you with the care you need, when you need it.     Your next appointment:   1 month(s)  The format for your next appointment:   In Person  Provider:   Glenetta Hew, MD      Leonie Man, MD, MS Glenetta Hew, M.D., M.S. Interventional Cardiologist  [facility]  Pager # [phone] Phone # [phone] [address] [place], [place] [address]   Thank you for choosing [facility]!!

## 2022-05-18 ENCOUNTER — Encounter: Payer: Self-pay | Admitting: Cardiology

## 2022-05-18 NOTE — Progress Notes (Incomplete)
Primary Care Provider: Tempie Hoist, [place] Cardiologist: Glenetta Hew, MD Electrophysiologist: None  Clinic Note: Chief Complaint  Patient presents with  . Establish Care    To discuss baseline cardiovascular risk  . New Patient (Initial Visit)    Hypertension    ===================================  ASSESSMENT/PLAN   Problem List Items Addressed This Visit       Cardiology Problems   Essential hypertension   Relevant Orders   EKG 12-Lead (Completed)   Lipid panel   Comprehensive metabolic panel   Hemoglobin A1c   Hyperlipidemia associated with type 2 diabetes mellitus (HCC)   Relevant Orders   EKG 12-Lead (Completed)   Lipid panel   Comprehensive metabolic panel   Hemoglobin A1c     Other   Family history of premature coronary artery disease   Relevant Orders   EKG 12-Lead (Completed)   Lipid panel   Comprehensive metabolic panel   Hemoglobin C5E   Metabolic syndrome - Primary   Relevant Orders   EKG 12-Lead (Completed)   Lipid panel   Comprehensive metabolic panel   Hemoglobin A1c   Moderate obesity   Relevant Orders   EKG 12-Lead (Completed)   Lipid panel   Comprehensive metabolic panel   Hemoglobin A1c    ===================================  HPI:    Caleb Mason is a 61 y.o. male who is being seen today for the evaluation of Elevated Blood Pressure Reading without Diagnosis of Hypertension at the request of Tempie Hoist, FNP.  Dawayne Milos was last seen on ***  Recent Hospitalizations: ***  Reviewed  CV studies:    The following studies were reviewed today: (if available, images/films reviewed: From Epic Chart or Care Everywhere) ***:   Interval History:   Ruari Lipuma   HA & fatigue when BPs were up in 170s -- has lost ~ 15 lb & doing better.  Working weing shifts & 2 jobs - had been eating poorly & poor sleep.  Cut out fast food & sweets - lost 15 lb.   CV Review of Symptoms (Summary): no chest pain or  dyspnea on exertion positive for - wgt loss - with improvement in BP & fatigue negative for - edema, irregular heartbeat, loss of consciousness, orthopnea, palpitations, paroxysmal nocturnal dyspnea, rapid heart rate, shortness of breath, or near syncope, TIA/amaurosis fugax, claudication.   REVIEWED OF SYSTEMS   ROS Dry cough -- allergies.    I have reviewed and (if needed) personally updated the patient's problem list, medications, allergies, past medical and surgical history, social and family history.   PAST MEDICAL HISTORY   Past Medical History:  Diagnosis Date  . Adenomatous colon polyp 2013   12m, tubular  . Allergy    seasonal  . Anemia   . [place]  . Chronic duodenal ulcer with gastric outlet obstruction 11/02/2018  . Complication of anesthesia    got sick   . Diverticulosis   . Essential hypertension    At night for been given official diagnosis.  .Marland KitchenGERD (gastroesophageal reflux disease)   . Impaired glucose regulation   . Injury    Broken hip  . Kidney stones   . Lumbar back pain 2011   had hip fracture that caused lower back problems => has troubles with sciatica  . Pancreatitis   . PONV (postoperative nausea and vomiting)   . Vitamin D deficiency     PAST SURGICAL HISTORY   Past Surgical History:  Procedure Laterality Date  .  BIOPSY  09/10/2018   Procedure: BIOPSY;  Surgeon: Lavena Bullion, DO;  Location: [facility];  Service: Endoscopy;;  . CHOLECYSTECTOMY  2008   had pancreatitis  . COLONOSCOPY W/ POLYPECTOMY    . CYSTOSCOPY WITH RETROGRADE PYELOGRAM, URETEROSCOPY AND STENT PLACEMENT Right 09/15/2012   Procedure: CYSTOSCOPY WITH right  RETROGRADE PYELOGRAM, right  URETEROSCOPY AND STENT PLACEMENT;  Surgeon: Bernestine Amass, MD;  Location[facility];  Service: Urology;  Laterality: Right;  . ESOPHAGOGASTRODUODENOSCOPY (EGD) WITH PROPOFOL N/A 09/10/2018   Procedure: ESOPHAGOGASTRODUODENOSCOPY (EGD) WITH PROPOFOL;   Surgeon: Lavena Bullion, DO;  Location: [facility];  Service: Endoscopy;  Laterality: N/A;  . HOLMIUM LASER APPLICATION Right 02/14/9146   Procedure: HOLMIUM LASER APPLICATION;  Surgeon: Bernestine Amass, MD;  [facility];  Service: Urology;  Laterality: Right;  . KNEE ARTHROSCOPY Right 1993   right    Immunization History  Administered Date(s) Administered  . Moderna Sars-Covid-2 Vaccination 07/18/2020, 08/15/2020    MEDICATIONS/ALLERGIES   Current Meds  Medication Sig  . cholecalciferol (VITAMIN D3) 25 MCG (1000 UNIT) tablet Take 1,000 Units by mouth once a week.  . pantoprazole (PROTONIX) 40 MG tablet TAKE 1 TABLET BY MOUTH ABOUT 30 MINUTES BEFORE BREAKFAST    No Known Allergies  SOCIAL HISTORY/FAMILY HISTORY   Reviewed in Epic:   Social History   Tobacco Use  . Smoking status: Former    Packs/day: 1.00    Years: 30.00    Total pack years: 30.00    Types: Cigarettes    Quit date: 09/15/2007    Years since quitting: 14.6  . Smokeless tobacco: Never  Vaping Use  . Vaping Use: Never used  Substance Use Topics  . Alcohol use: No    Alcohol/week: 0.0 standard drinks of alcohol  . Drug use: No   Social History   Social History Narrative   Married with 2 sons; lives with his wife.  Identifies himself as a male.   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      As of May 12, 2022: Exercises roughly 7 days a week for least an hour.   Has given up fast food, eating much better.  Giving up sweets.  Getting better sleep.  Trying to work better hours. => Has lost 15 pounds.  Also noted improvement in blood pressure from the 170s to 150/90 today.         Family History  Problem Relation Age of Onset  . Heart disease Mother        Does not know the  details.  . Esophageal cancer Father 17       died as a result - Dx'd post-CABG  . Coronary artery disease Father 29       cabg  . Heart attack Paternal Grandfather 12       died  . Colon cancer Neg Hx   . Pancreatic cancer Neg Hx   . Stomach cancer Neg Hx   . Liver disease Neg Hx   . Colon polyps Neg Hx     OBJCTIVE -PE, EKG, labs   Wt Readings from Last 3 Encounters:  05/12/22 267 lb 12.8 oz (121.5 kg)  12/13/18 255 lb (115.7 kg)  11/30/18 260 lb (117.9 kg)  Physical Exam: BP (!) 152/90 (BP Location: Left Arm, Patient Position: Sitting)   Pulse 66   Ht '6\' 1"'$  (1.854 m)   Wt 267 lb 12.8 oz (121.5 kg)   SpO2 97%   BMI 35.33 kg/m  Physical Exam   Adult ECG Report  Rate: *** ;  Rhythm: {rhythm:17366};   Narrative Interpretation: ***  Recent Labs:  ***  No results found for: "CHOL", "HDL", "LDLCALC", "LDLDIRECT", "TRIG", "CHOLHDL" Lab Results  Component Value Date   CREATININE 0.92 09/10/2018   BUN 6 09/10/2018   NA 136 09/10/2018   K 3.4 (L) 09/10/2018   CL 107 09/10/2018   CO2 22 09/10/2018      Latest Ref Rng & Units 09/11/2018    5:00 AM 09/10/2018    3:13 AM 09/09/2018    4:50 AM  CBC  WBC 4.0 - 10.5 K/uL 7.7  8.6  17.1   Hemoglobin 13.0 - 17.0 g/dL 11.2  11.0  13.3   Hematocrit 39.0 - 52.0 % 34.0  33.4  41.0   Platelets 150 - 400 K/uL 390  369  531     No results found for: "HGBA1C" No results found for: "TSH"  ================================================== I spent a total of *** minutes with the patient spent in direct patient consultation.  Additional time spent with chart review  / charting (studies, outside notes, etc): *** min Total Time: *** min  Current medicines are reviewed at length with the patient today.  (+/- concerns) ***  Notice: This dictation was prepared with Dragon dictation along with smart phrase technology. Any transcriptional errors that result from this process are unintentional and may not be corrected upon  review.   Studies Ordered:  Orders Placed This Encounter  Procedures  . Lipid panel  . Comprehensive metabolic panel  . Hemoglobin A1c  . EKG 12-Lead   No orders of the defined types were placed in this encounter.   Patient Instructions / Medication Changes & Studies & Tests Ordered   Patient Instructions  Medication Instructions:   No changes  *If you need a refill on your cardiac medications before your next appointment, please call your pharmacy*   Lab Work: second week in Dec 2023  [place], [place] [address]  Or [facility]  [address], [place], [place] If you have labs (blood work) drawn today and your tests are completely normal, you will receive your results only by: [employer] (if you have MyChart) OR A paper copy in the mail If you have any lab test that is abnormal or we need to change your treatment, we will call you to review the results.   Testing/Procedures:  Not needed  Follow-Up: At [facility], you and your health needs are our priority.  As part of our continuing mission to provide you with exceptional heart care, we have created designated Provider Care Teams.  These Care Teams include your primary Cardiologist (physician) and Advanced Practice Providers (APPs -  Physician Assistants and Nurse Practitioners) who all work together to provide you with the care you need, when you need it.     Your next appointment:   1 month(s)  The format for your next appointment:   In Person  Provider:   Glenetta Hew, MD       Leonie Man, MD, MS Glenetta Hew, M.D., M.S. Interventional Cardiologist  [facility]  Pager # [phone] Phone # [phone] [address]  [place], [place] [address]   Thank you for choosing [facility]!!

## 2022-05-19 ENCOUNTER — Encounter: Payer: Self-pay | Admitting: Cardiology

## 2022-05-19 NOTE — Assessment & Plan Note (Signed)
I explained to him the concept of metabolic syndrome. Obesity, hypertension, high triglycerides, low LDL, hyperglycemia-he meets all 5 criteria.  He is very reluctant to being on medications although he does acknowledge he probably will end up being on meds.  He wants to give himself a chance to see if getting his exercise regimen back in place as well as adjusting his diet.  His goal is to lose another 15 to 20 pounds in the next 6 months or so.  We will reassess labs including lipid panel, A1c and CMP in December in order to give a little bit more time), since his last labs were from February.  Based on these labs and follow-up BP check, will determine the need to initiate medications for BP, lipids and even potentially diabetes.

## 2022-05-19 NOTE — Assessment & Plan Note (Addendum)
He really does not have any family history of premature cardiac disease.  His father is pretty old when he had his MI and CABG.  Despite this he is concerned because he knows that he let her self get away.  Anticipate potentially checking a coronary calcium score and even potentially a Coronary CTA in follow-up depending on his labs.

## 2022-05-19 NOTE — Assessment & Plan Note (Signed)
Blood pressure today is quite high.  He says his been in the 170s berry.  Therefore he went to have the diagnosis of hypertension.  Per guidelines, recommendation would be to allow for 3 to 6 months of lifestyle modification.  He would like to some more time before we consider initiating therapy.  I will see him back after recheck his labs.

## 2022-05-19 NOTE — Assessment & Plan Note (Addendum)
BMI is 35 so is not quite morbidly obese, but is working on weight loss.  Plan to reassess in a few months.

## 2022-05-19 NOTE — Assessment & Plan Note (Signed)
Borderline diabetic with A1c of 6.4, but not correlate.  He is morbidly obese.  He has been guilty of dietary discretion per his own admission. Most recent labs were drawn in February of this year.  He has subsequently gone into a significant diet and exercise program with weight loss.  We can reassess next month to see how far he is improved.  For metabolic syndrome, target LDL should be at least less than 100, however based on results would potentially consider Coronary Calcium Score, which would help direct therapy further.

## 2022-06-18 ENCOUNTER — Other Ambulatory Visit (HOSPITAL_COMMUNITY)
Admission: RE | Admit: 2022-06-18 | Discharge: 2022-06-18 | Disposition: A | Discharge status: Home / Self Care | Payer: BC Managed Care – PPO | Source: Ambulatory Visit | Attending: Cardiology | Admitting: Cardiology

## 2022-06-18 DIAGNOSIS — E668 Other obesity: Secondary | ICD-10-CM | POA: Diagnosis not present

## 2022-06-18 DIAGNOSIS — Z8249 Family history of ischemic heart disease and other diseases of the circulatory system: Secondary | ICD-10-CM | POA: Diagnosis not present

## 2022-06-18 DIAGNOSIS — E1169 Type 2 diabetes mellitus with other specified complication: Secondary | ICD-10-CM | POA: Insufficient documentation

## 2022-06-18 DIAGNOSIS — E785 Hyperlipidemia, unspecified: Secondary | ICD-10-CM | POA: Insufficient documentation

## 2022-06-18 DIAGNOSIS — E8881 Metabolic syndrome: Secondary | ICD-10-CM | POA: Diagnosis not present

## 2022-06-18 DIAGNOSIS — I10 Essential (primary) hypertension: Secondary | ICD-10-CM | POA: Diagnosis not present

## 2022-06-18 LAB — COMPREHENSIVE METABOLIC PANEL
ALT: 39 U/L (ref 0–44)
AST: 25 U/L (ref 15–41)
Albumin: 3.5 g/dL (ref 3.5–5.0)
Alkaline Phosphatase: 91 U/L (ref 38–126)
Anion gap: 5 (ref 5–15)
BUN: 11 mg/dL (ref 6–20)
CO2: 26 mmol/L (ref 22–32)
Calcium: 8.7 mg/dL — ABNORMAL LOW (ref 8.9–10.3)
Chloride: 106 mmol/L (ref 98–111)
Creatinine, Ser: 0.77 mg/dL (ref 0.61–1.24)
GFR, Estimated: >60 mL/min (ref >60)
Glucose, Bld: 173 mg/dL — ABNORMAL HIGH (ref 70–99)
Potassium: 4.5 mmol/L (ref 3.5–5.1)
Sodium: 137 mmol/L (ref 135–145)
Total Bilirubin: 0.3 mg/dL (ref 0.3–1.2)
Total Protein: 6.8 g/dL (ref 6.5–8.1)

## 2022-06-18 LAB — LIPID PANEL
Cholesterol: 191 mg/dL (ref 0–200)
HDL: 38 mg/dL — ABNORMAL LOW (ref >40)
LDL Cholesterol: 102 mg/dL — ABNORMAL HIGH (ref 0–99)
Total CHOL/HDL Ratio: 5 RATIO
Triglycerides: 256 mg/dL — ABNORMAL HIGH (ref <150)
VLDL: 51 mg/dL — ABNORMAL HIGH (ref 0–40)

## 2022-06-19 LAB — HEMOGLOBIN A1C
Hgb A1c MFr Bld: 6.2 % — ABNORMAL HIGH (ref 4.8–5.6)
Mean Plasma Glucose: 131 mg/dL

## 2022-06-20 ENCOUNTER — Ambulatory Visit: Payer: BC Managed Care – PPO | Admitting: Cardiology

## 2022-06-22 NOTE — Progress Notes (Unsigned)
Cardiology Clinic Note   Patient Name: Caleb Mason Date of Encounter: 06/23/2022  Primary Care Provider:  Tempie Hoist, FNP Primary Cardiologist:  Glenetta Hew, MD  Patient Profile     Caleb Mason 61 year old male presents to the clinic today for follow-up evaluation of his essential hypertension.  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   GERD (gastroesophageal reflux disease)    Impaired glucose regulation    Injury    Broken hip   Kidney stones    Lumbar back pain 2011   had hip fracture that caused lower back problems => has troubles with sciatica   Pancreatitis    PONV (postoperative nausea and vomiting)    Vitamin D deficiency    Past Surgical History:  Procedure Laterality Date   BIOPSY  09/10/2018   Procedure: BIOPSY;  Surgeon: CLavena Bullion DO;  [facility];  Service: Endoscopy;;   CHOLECYSTECTOMY  2008   had pancreatitis   COLONOSCOPY W/ POLYPECTOMY     CYSTOSCOPY WITH RETROGRADE PYELOGRAM, URETEROSCOPY AND STENT PLACEMENT Right 09/15/2012   Procedure: CYSTOSCOPY WITH right  RETROGRADE PYELOGRAM, right  URETEROSCOPY AND STENT PLACEMENT;  Surgeon: DBernestine Amass MD;  [facility]  Service: Urology;  Laterality: Right;   ESOPHAGOGASTRODUODENOSCOPY (EGD) WITH PROPOFOL N/A 09/10/2018   Procedure: ESOPHAGOGASTRODUODENOSCOPY (EGD) WITH PROPOFOL;  Surgeon: CLavena Bullion DO;  [facility];  Service: Endoscopy;  Laterality: N/A;   HOLMIUM LASER APPLICATION Right 30/03/3817  Procedure: HOLMIUM LASER APPLICATION;  Surgeon: DBernestine Amass MD;  [facility]   Service: Urology;  Laterality: Right;   KNEE ARTHROSCOPY Right 1993   right    Allergies  No Known Allergies  History of Present Illness     [name] has a PMH of essential hypertension, hiatal hernia, GERD, metabolic syndrome, HLD, and mild anemia.  He has a family history of premature coronary artery disease.  His father was diagnosed with CAD at age 211and passed away recently.  After his father's passing he has been more aware/focused on his health.  He was initially referred to Dr. HEllyn Hackfor evaluation and help with management of his blood pressure.  He presented to the clinic on 05/12/2022.  During that time he reported blood pressures with systolic in the 1299range.  He had been eating poorly and not sleeping well.  His CBC showed an elevated glucose of 173, renal function  stable.  His lipid panel showed overall cholesterol of 191 and a LDL of 102 with triglycerides of 256.  It was felt that his lifestyle could be adjusted and no medications were initiated at that time.  He presents to the clinic today for follow-up evaluation and states he feels well.  He recently returned from a hunting trip where he was climbing up and down hills and valleys.  He did well and denies exertional chest discomfort.  He is lost around 30 pounds and his blood pressure today is around 142/80-70.  He has been getting similar readings at home.  He  plans to lose another 45 pounds.  He has made strict adjustments with his diet and remains very physically active farming.  We reviewed his lipid panel and he expressed understanding.  I have asked him to continue to maintain his physical activity and maintain a strict diet.  We will give him a blood pressure log, salty 6 diet sheet, repeat fasting lipids and 12 weeks and plan follow-up after lab work.  Today he denies chest pain, shortness of breath, lower extremity edema, fatigue, palpitations, melena, hematuria, hemoptysis, diaphoresis, weakness, presyncope,  syncope, orthopnea, and PND.   Home Medications    Prior to Admission medications   Medication Sig Start Date End Date Taking? Authorizing Provider  cholecalciferol (VITAMIN D3) 25 MCG (1000 UNIT) tablet Take 1,000 Units by mouth once a week.    [provider]  pantoprazole (PROTONIX) 40 MG tablet TAKE 1 TABLET BY MOUTH ABOUT 30 MINUTES BEFORE BREAKFAST 04/22/22   Gatha Mayer, MD    Family History    Family History  Problem Relation Age of Onset   Heart disease Mother        Does not know the details.   Esophageal cancer Father 39       died as a result - Dx'd post-CABG   Coronary artery disease Father 43       cabg   Heart attack Paternal Grandfather 35       died   Colon cancer Neg Hx    Pancreatic cancer Neg Hx    Stomach cancer Neg Hx    Liver disease Neg Hx    Colon polyps Neg Hx    He indicated that his mother is alive. He indicated that his father is deceased. He indicated that his paternal grandfather is deceased. He indicated that the status of his neg hx is unknown.  Social History    Social History   Socioeconomic History   Marital status: Married    Spouse name: Not on file   Number of children: 2   Years of education: Not on file   Highest education level: Associate degree: occupational, Hotel manager, or vocational program  Occupational History   Occupation: Probation officer  Tobacco Use   Smoking status: Former    Packs/day: 1.00    Years: 30.00    Total pack years: 30.00    Types: Cigarettes    Quit date: 09/15/2007    Years since quitting: 14.7   Smokeless tobacco: Never  Vaping Use   Vaping Use: Never used  Substance and Sexual Activity   Alcohol use: No    Alcohol/week: 0.0 standard drinks of alcohol   Drug use: No   Sexual activity: Yes    Partners: Female  Other Topics Concern   Not on file  Social History Narrative   Married with 2 sons; lives with his wife.  Identifies himself as a male.   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      As of May 12, 2022: Exercises roughly 7 days a week for least an hour.   Has given up fast food, eating much better.  Giving up sweets.  Getting better sleep.  Trying to work better hours. => Has lost 15 pounds.  Also noted improvement in blood pressure from the  170s to 150/90 today.         Social Determinants of Health   Financial Resource Strain: Not on file  Food Insecurity: Not on file  Transportation Needs: Not on file  Physical Activity: Not on file  Stress: Not on file  Social Connections: Not on file  Intimate Partner Violence: Not on file     Review of Systems    General:  No chills, fever, night sweats or weight changes.  Cardiovascular:  No chest pain, dyspnea on exertion, edema, orthopnea, palpitations, paroxysmal nocturnal dyspnea. Dermatological: No rash, lesions/masses Respiratory: No cough, dyspnea Urologic: No hematuria, dysuria Abdominal:   No nausea, vomiting, diarrhea, bright red blood per rectum, melena, or hematemesis Neurologic:  No visual changes, wkns, changes in mental status. All other systems reviewed and are otherwise negative except as noted above.  Physical Exam    VS:  BP (!) 142/76   Pulse 77   Ht '6\' 1"'$  (1.854 m)   Wt 260 lb (117.9 kg)   SpO2 98%   BMI 34.30 kg/m  , BMI Body mass index is 34.3 kg/m. GEN: Well nourished, well developed, in no acute distress. HEENT: normal. Neck: Supple, no JVD, carotid bruits, or masses. Cardiac: RRR, no murmurs, rubs, or gallops. No clubbing, cyanosis, edema.  Radials/DP/PT 2+ and equal bilaterally.  Respiratory:  Respirations regular and unlabored, clear to auscultation bilaterally. GI: Soft, nontender, nondistended, BS + x 4. MS: no deformity or atrophy. Skin: warm and dry, no rash. Neuro:  Strength  and sensation are intact. Psych: Normal affect.  Accessory Clinical Findings    Recent Labs: 06/18/2022: ALT 39; BUN 11; Creatinine, Ser 0.77; Potassium 4.5; Sodium 137   Recent Lipid Panel    Component Value Date/Time   CHOL 191 06/18/2022 0844   TRIG 256 (H) 06/18/2022 0844   HDL 38 (L) 06/18/2022 0844   CHOLHDL 5.0 06/18/2022 0844   VLDL 51 (H) 06/18/2022 0844   LDLCALC 102 (H) 06/18/2022 0844    HYPERTENSION CONTROL Vitals:   06/23/22 1327 06/23/22 1339  BP: (!) 142/80 (!) 142/76    The patient's blood pressure is elevated above target today.  In order to address the patient's elevated BP: Blood pressure will be monitored at home to determine if medication changes need to be made.; The blood pressure is usually elevated in clinic.  Blood pressures monitored at home have been optimal.       ECG personally reviewed by me today- none today.  Assessment & Plan   1.  Essential hypertension-BP today 142/76.  Has lost 30 pounds and plans to lose around 45 more over time. Maintain blood pressure log Continue weight loss Heart healthy low-sodium diet-salty 6 given Increase physical activity as tolerated  Hyperlipidemia 06/18/2022: Cholesterol 191; HDL 38; LDL Cholesterol 102; Triglycerides 256; VLDL 51 Taking fish oil Heart healthy low-sodium high-fiber diet Increase physical activity as tolerated Plan for repeat fasting lipids in around 10-12 weeks.  Family history of premature coronary disease-father recently passed away at age 62.  He was diagnosed with CAD.  Metabolic syndrome-glucose elevated at 173. Heart healthy low-sodium carb modified diet Increase physical activity as tolerated Continue weight loss  Disposition: Follow-up with Dr. Ellyn Hack or me in 3 months.   Jossie Ng. Atilano Covelli NP-C     06/23/2022, 1:42 PM [place] [address] Office [phone] Fax [phone]    I spent 13 minutes examining this  patient, reviewing medications, and using patient centered  shared decision making involving her cardiac care.  Prior to her visit I spent greater than 20 minutes reviewing her past medical history,  medications, and prior cardiac tests.

## 2022-06-23 ENCOUNTER — Encounter: Payer: Self-pay | Admitting: General Practice

## 2022-06-23 ENCOUNTER — Ambulatory Visit: Payer: BC Managed Care – PPO | Attending: Cardiology | Admitting: General Practice

## 2022-06-23 VITALS — BP 142/76 | HR 77 | Ht 73.0 in | Wt 260.0 lb

## 2022-06-23 DIAGNOSIS — E8881 Metabolic syndrome: Secondary | ICD-10-CM

## 2022-06-23 DIAGNOSIS — I10 Essential (primary) hypertension: Secondary | ICD-10-CM | POA: Diagnosis not present

## 2022-06-23 DIAGNOSIS — Z8249 Family history of ischemic heart disease and other diseases of the circulatory system: Secondary | ICD-10-CM

## 2022-06-23 DIAGNOSIS — E7849 Other hyperlipidemia: Secondary | ICD-10-CM

## 2022-06-23 NOTE — Patient Instructions (Signed)
Medication Instructions:  The current medical regimen is effective;  continue present plan and medications as directed. Please refer to the Current Medication list given to you today.  *If you need a refill on your cardiac medications before your next appointment, please call your pharmacy*  Lab Work: FASTING LIPID PANEL IN 3 MONTHS If you have labs (blood work) drawn today and your tests are completely normal, you will receive your results only by:  [place] (if you have MyChart) OR  A paper copy in the mail If you have any lab test that is abnormal or we need to change your treatment, we will call you to review the results.  Testing/Procedures: NONE   Follow-Up: At [facility], you and your health needs are our priority.  As part of our continuing mission to provide you with exceptional heart care, we have created designated Provider Care Teams.  These Care Teams include your primary Cardiologist (physician) and Advanced Practice Providers (APPs -  Physician Assistants and Nurse Practitioners) who all work together to provide you with the care you need, when you need it.  Your next appointment:   3 month(s)  The format for your next appointment:   In Person  Provider:   Glenetta Hew, MD     Other Instructions PLEASE READ AND FOLLOW ATTACHED  SALTY 6  Take and log your blood pressure daily and bring log with you to your follow up appointment  [place]

## 2022-10-13 ENCOUNTER — Telehealth: Payer: Self-pay | Admitting: Cardiology

## 2022-10-13 NOTE — Telephone Encounter (Signed)
Left message to call back. Lab is in the computer for [facility] to see. No reprint needed.

## 2022-10-13 NOTE — Telephone Encounter (Signed)
Patient's wife called stating patient has missed placed his order for the lab work he needs done prior to his office visit.  She would like another copy of the orders mailed to them.

## 2022-10-16 NOTE — Telephone Encounter (Signed)
Spoke with patient and let her know message below.  She will have those done 1-2 weeks prior to appt.

## 2022-10-21 ENCOUNTER — Ambulatory Visit: Payer: BC Managed Care – PPO | Admitting: Cardiology

## 2022-10-27 ENCOUNTER — Other Ambulatory Visit (HOSPITAL_COMMUNITY)
Admission: RE | Admit: 2022-10-27 | Discharge: 2022-10-27 | Disposition: A | Discharge status: Home / Self Care | Payer: BC Managed Care – PPO | Source: Ambulatory Visit | Attending: Cardiology | Admitting: Cardiology

## 2022-10-27 DIAGNOSIS — E7849 Other hyperlipidemia: Secondary | ICD-10-CM | POA: Diagnosis not present

## 2022-10-27 DIAGNOSIS — E8881 Metabolic syndrome: Secondary | ICD-10-CM | POA: Insufficient documentation

## 2022-10-27 DIAGNOSIS — I10 Essential (primary) hypertension: Secondary | ICD-10-CM | POA: Diagnosis not present

## 2022-10-27 DIAGNOSIS — Z8249 Family history of ischemic heart disease and other diseases of the circulatory system: Secondary | ICD-10-CM | POA: Insufficient documentation

## 2022-10-27 DIAGNOSIS — E668 Other obesity: Secondary | ICD-10-CM | POA: Diagnosis not present

## 2022-10-27 LAB — COMPREHENSIVE METABOLIC PANEL
ALT: 33 U/L (ref 0–44)
AST: 22 U/L (ref 15–41)
Albumin: 3.5 g/dL (ref 3.5–5.0)
Alkaline Phosphatase: 60 U/L (ref 38–126)
Anion gap: 6 (ref 5–15)
BUN: 14 mg/dL (ref 6–20)
CO2: 26 mmol/L (ref 22–32)
Calcium: 8.7 mg/dL — ABNORMAL LOW (ref 8.9–10.3)
Chloride: 104 mmol/L (ref 98–111)
Creatinine, Ser: 0.72 mg/dL (ref 0.61–1.24)
GFR, Estimated: >60 mL/min (ref >60)
Glucose, Bld: 107 mg/dL — ABNORMAL HIGH (ref 70–99)
Potassium: 4.2 mmol/L (ref 3.5–5.1)
Sodium: 136 mmol/L (ref 135–145)
Total Bilirubin: 0.6 mg/dL (ref 0.3–1.2)
Total Protein: 7 g/dL (ref 6.5–8.1)

## 2022-10-27 LAB — LIPID PANEL
Cholesterol: 204 mg/dL — ABNORMAL HIGH (ref 0–200)
HDL: 39 mg/dL — ABNORMAL LOW (ref >40)
LDL Cholesterol: 135 mg/dL — ABNORMAL HIGH (ref 0–99)
Total CHOL/HDL Ratio: 5.2 RATIO
Triglycerides: 148 mg/dL (ref <150)
VLDL: 30 mg/dL (ref 0–40)

## 2022-10-27 LAB — HEMOGLOBIN A1C
Hgb A1c MFr Bld: 5.9 % — ABNORMAL HIGH (ref 4.8–5.6)
Mean Plasma Glucose: 122.63 mg/dL

## 2022-11-05 NOTE — Progress Notes (Unsigned)
Cardiology Clinic Note   Date: 11/06/2022 ID: Caleb Mason, DOB [birth year], MRN [id number]  Primary Cardiologist:  Bryan Lemma, MD  Patient Profile    Caleb Mason is a 62 y.o. male who presents to the clinic today for 94-month follow-up.  Past medical history significant for: Hypertension. Hyperlipidemia. Lipid panel 10/27/2022: LDL 135, HDL 39, TG 148, total 204. GERD. Hiatal hernia. Metabolic syndrome. Family history of premature CAD.   History of Present Illness    Caleb Mason was first evaluated by Dr. Herbie Baltimore on 05/12/2022 for hypertension and to establish cardiac care at the request of Georgana Curio, FNP.  He is followed for the above outlined history.  Patient was last seen in the office by Edd Fabian, NP on 06/23/2022 for routine follow-up.  He was doing well at that time and working on weight loss with 30 pounds down.  He was encouraged to increase physical activity as tolerated, avoid sodium, and continue weight loss.  Patient had repeat lipid panel April 2024 which showed LDL 135.  Dr. Herbie Baltimore started patient on rosuvastatin 20 mg.  Today, patient provides BP log but is concerned that his home cuff is not accurate and reading higher than manual pressure. Compared readings x 4 throughout visit. 3 out of 4 readings were >20/10 mmHg higher on his cuff than manual pressure. He is concerned about starting medications based off readings that are inaccurate. The majority of home BP (scanned into chart) range from 128-166/72-99. Home cuff will occasionally indicate irregular heart beat. Patient denies palpitations. He reports occasional headache that correlates with higher BP. No dizziness or blurred vision. He plans to purchase a different cuff today. He also mentions a 1 month history of intermittent central chest pain that he has difficulty describing "feels like when you stay in the pool too long."  Episodes are brief lasting seconds with associated shortness of breath occurring  with exertion.  He can have several episodes a day and then none for the next couple of days. No current chest pain.  He has a strong family history of premature CAD and is a prediabetic.    ROS: All other systems reviewed and are otherwise negative except as noted in History of Present Illness.  Studies Reviewed    ECG is not ordered today.   Risk Assessment/Calculations      HYPERTENSION CONTROL Vitals:   11/06/22 1509 11/06/22 1646  BP: (!) 150/90 (!) 150/88    The patient's blood pressure is elevated above target today.  In order to address the patient's elevated BP:            Physical Exam    VS:  BP (!) 150/90   Pulse 62   Ht 6' (1.829 m)   Wt 260 lb 6.4 oz (118.1 kg)   SpO2 97%   BMI 35.32 kg/m  , BMI Body mass index is 35.32 kg/m.  GEN: Well nourished, well developed, in no acute distress. Neck: No JVD or carotid bruits. Cardiac:  RRR. No murmurs. No rubs or gallops.   Respiratory:  Respirations regular and unlabored. Clear to auscultation without rales, wheezing or rhonchi. GI: Soft, nontender, nondistended. Extremities: Radials/DP/PT 2+ and equal bilaterally. No clubbing or cyanosis. No edema.  Skin: Warm and dry, no rash. Neuro: Strength intact.  Assessment & Plan    Hypertension. BP today 150/90 on intake and 150/88 on my recheck.  Home BP ranges from 128-166/72-99.  Patient reports occasional headaches that correlate with increased BP.  Home BP  cuff calibrated today with 3 out of 4 readings  >20/10 mmHg higher on his home cuff.  Patient denies dizziness or vision changes.  Hesitant to start medicine based on readings that may be inaccurate.  Patient plans to buy an [employer] BP cuff today.  He will continue to log BP with new cuff and return in 1 week for nurse visit to calibrate cuff and review BP log.  If BP persists >130/80 will begin antihypertensive therapy. Precordial/atypical chest pain.  Patient describes a 1 month history of episodes of intermittent  central chest pain that he has difficulty describing "feels like when you say in the pool too long."  Episodes are brief lasting seconds with associated shortness of breath occurring with exertion.  He can have several episodes a day and then none for the next couple of days before episodes recur.  Given his strong family history of premature CAD and prediabetes will order coronary CTA for further evaluation. Hyperlipidemia.  LDL April 2024 135, not at goal.  Begin rosuvastatin 20 mg daily.  Repeat lipid panel and LFTs in 8-10 weeks.  Disposition: Crestor 20 mg daily.  Coronary CTA.  Continue monitoring BP and return in 1 week for nurse visit to calibrate new cuff and review BP log.         Signed, Etta Grandchild. Annai Heick, DNP, NP-C

## 2022-11-06 ENCOUNTER — Ambulatory Visit: Payer: BC Managed Care – PPO | Attending: Cardiology | Admitting: Student

## 2022-11-06 ENCOUNTER — Encounter: Payer: Self-pay | Admitting: Student

## 2022-11-06 VITALS — BP 150/88 | HR 62 | Ht 72.0 in | Wt 260.4 lb

## 2022-11-06 DIAGNOSIS — I10 Essential (primary) hypertension: Secondary | ICD-10-CM

## 2022-11-06 DIAGNOSIS — E785 Hyperlipidemia, unspecified: Secondary | ICD-10-CM

## 2022-11-06 DIAGNOSIS — R072 Precordial pain: Secondary | ICD-10-CM | POA: Diagnosis not present

## 2022-11-06 MED ORDER — ROSUVASTATIN CALCIUM 20 MG PO TABS: 20.0000 mg | ORAL_TABLET | Freq: Every day | ORAL | 3 refills | Status: DC

## 2022-11-06 MED ORDER — METOPROLOL TARTRATE 50 MG PO TABS: ORAL_TABLET | ORAL | 0 refills | Status: DC

## 2022-11-06 NOTE — Patient Instructions (Addendum)
Medication Instructions:   START Rosuvastatin (Crestor) 20 mg daily TAKE Metoprolol Tartrate (Lopressor) 50 mg 2 hours prior to scheduled Coronary CTA   *If you need a refill on your cardiac medications before your next appointment, please call your pharmacy*  Lab Work: Your physician recommends that you return for lab work in 8-10 weeks:  Fasting Lipid Panel-DO NOT eat or drink past midnight. Okay to have water and/or black coffee only the morning of lab work   Hepatic (Liver) Function Test   If you have labs (blood work) drawn today and your tests are completely normal, you will receive your results only by: MyChart Message (if you have MyChart) OR A paper copy in the mail If you have any lab test that is abnormal or we need to change your treatment, we will call you to review the results.  Testing/Procedures: Your physician has requested that you have cardiac CT. Cardiac CT Angiography (CTA), is a special type of CT scan that uses a computer to produce multi-dimensional views of major blood vessels throughout the body. In CT angiography, a contrast material is injected through an IV to help visualize the blood vessels. A cardiac CT angiogram is a procedure to look at the heart and the area around the heart. It may be done to help find the cause of chest pains or other symptoms of heart disease. During this procedure, a substance called contrast dye is injected into a vein in the arm. The contrast highlights the blood vessels in the area to be checked. A large X-ray machine (CT scanner), then takes detailed pictures of the heart and the surrounding area. The procedure is also sometimes called a coronary CT angiogram, coronary artery scanning, or CTA.  Follow-Up: At [facility], you and your health needs are our priority.  As part of our continuing mission to provide you with exceptional heart care, we have created designated Provider Care Teams.  These Care Teams include your primary  Cardiologist (physician) and Advanced Practice Providers (APPs -  Physician Assistants and Nurse Practitioners) who all work together to provide you with the care you need, when you need it.  Your next appointment:   1 week(s) 6 month(s)  Provider:   Nurse Visit  Bryan Lemma, MD     Other Instructions Continue to monitor blood pressure at home for 1 week and bring log to 1 week appointment along with blood pressure machine      Your cardiac CT will be scheduled at one of the below locations:   [facility] [address] [place], [place] [address] [phone]  OR  [facility] [address] [place], [place] [address] [phone]  OR   [facility] [address] [place], [place] [address] [phone]  If scheduled at [facility], please arrive at the [facility] Entrance (Entrance C2) of [facility] 30 minutes prior to test start time. You can use the FREE valet parking offered at entrance C (encouraged to control the heart rate for the test)  Proceed to the [facility] Radiology Department (first floor) to check-in and test prep.  All radiology patients and guests should use entrance C2 at [facility], accessed from [facility], even though the hospital's physical address listed is [address].    If scheduled at [facility] or [facility], please arrive 15 mins early for check-in and  test prep.   Please follow these instructions carefully (unless otherwise directed):  Hold all erectile dysfunction medications at least 3 days (72 hrs) prior to test. (Ie viagra, cialis, sildenafil, tadalafil, etc) We will administer nitroglycerin during this exam.   On the Night Before the Test: Be sure to Drink plenty of water. Do not consume any caffeinated/decaffeinated beverages or  chocolate 12 hours prior to your test. Do not take any antihistamines 12 hours prior to your test. If the patient has contrast allergy: Patient will need a prescription for Prednisone and very clear instructions (as follows): Prednisone 50 mg - take 13 hours prior to test Take another Prednisone 50 mg 7 hours prior to test Take another Prednisone 50 mg 1 hour prior to test Take Benadryl 50 mg 1 hour prior to test Patient must complete all four doses of above prophylactic medications. Patient will need a ride after test due to Benadryl.  On the Day of the Test: Drink plenty of water until 1 hour prior to the test. Do not eat any food 1 hour prior to test. You may take your regular medications prior to the test.  Take metoprolol (Lopressor) two hours prior to test. If you take Furosemide/Hydrochlorothiazide/Spironolactone, please HOLD on the morning of the test. FEMALES- please wear underwire-free bra if available, avoid dresses & tight clothing  After the Test: Drink plenty of water. After receiving IV contrast, you may experience a mild flushed feeling. This is normal. On occasion, you may experience a mild rash up to 24 hours after the test. This is not dangerous. If this occurs, you can take Benadryl 25 mg and increase your fluid intake. If you experience trouble breathing, this can be serious. If it is severe call 911 IMMEDIATELY. If it is mild, please call our office. If you take any of these medications: Glipizide/Metformin, Avandament, Glucavance, please do not take 48 hours after completing test unless otherwise instructed.  We will call to schedule your test 2-4 weeks out understanding that some insurance companies will need an authorization prior to the service being performed.   For non-scheduling related questions, please contact the cardiac imaging nurse navigator should you have any questions/concerns: Rockwell Alexandria, Cardiac Imaging Nurse Navigator Larey Brick, Cardiac  Imaging Nurse Navigator [facility] Direct Office Dial: [phone]   For scheduling needs, including cancellations and rescheduling, please call [place], [phone].

## 2022-11-11 ENCOUNTER — Telehealth: Payer: Self-pay | Admitting: Cardiology

## 2022-11-11 NOTE — Telephone Encounter (Signed)
Wife called wanting to know if patient's nurse visit appt could be changed to 1:30pm that he has scheduled for Friday 5/10.

## 2022-11-11 NOTE — Telephone Encounter (Signed)
Patient has nurse visit at 2:00. His wife ask if he can come at 1:30.  Advised this is fine

## 2022-11-12 ENCOUNTER — Telehealth: Payer: Self-pay | Admitting: *Deleted

## 2022-11-12 NOTE — Telephone Encounter (Signed)
-----   Message from Marykay Lex, MD sent at 11/01/2022  4:12 PM EDT ----- Lab results:  Hemoglobin A1c is improved down to 5.9 from 6.2. Cholesterol on the other hand has gotten worse.  Total cholesterol is up to 204, and LDL is increased from 102 to now 135.  Chemistry panel is pretty stable but glucose is still little high.  Notably improved from 4 months ago.  Unfortunately, with the cholesterol on the wrong direction, I am not sure that we have yet to get his LDL down below 100 and close to 70 without treatment.  Would like to try rosuvastatin 20 mg daily.  Bryan Lemma, MD   Rx: Rosuvastatin 20 mg p.o. daily.  Dispense #30 tablets, 11 refills.  (If tolerated, will change to 90-day supply)

## 2022-11-13 ENCOUNTER — Telehealth (HOSPITAL_COMMUNITY): Payer: Self-pay | Admitting: Emergency Medicine

## 2022-11-13 NOTE — Telephone Encounter (Signed)
Reaching out to patient to offer assistance regarding upcoming cardiac imaging study; pt verbalizes understanding of appt date/time, parking situation and where to check in, pre-test NPO status and medications ordered, and verified current allergies; name and call back number provided for further questions should they arise Ikesha Siller RN Navigator Cardiac Imaging  Heart and Vascular [phone] office [phone] cell 

## 2022-11-14 ENCOUNTER — Encounter: Payer: Self-pay | Admitting: *Deleted

## 2022-11-14 ENCOUNTER — Ambulatory Visit (HOSPITAL_COMMUNITY): Payer: BC Managed Care – PPO

## 2022-11-14 ENCOUNTER — Ambulatory Visit (HOSPITAL_COMMUNITY): Payer: BC Managed Care – PPO | Attending: Cardiovascular Disease | Admitting: *Deleted

## 2022-11-14 VITALS — BP 126/87 | HR 73 | Ht 72.0 in | Wt 260.0 lb

## 2022-11-14 DIAGNOSIS — I10 Essential (primary) hypertension: Secondary | ICD-10-CM

## 2022-11-14 NOTE — Progress Notes (Signed)
Pt came in for B/P check  B/P on pt's machine is listed on vital signs manuel B/P with large cuff was 112/72. Pt also noted episode yesterday felt bad in chest area broke out in sweat as well . Pt was scheduled for Cardiac CT today but this was cx due to equipment issues  Instructed pt if has another episode should go to ED for eval and tx   Will put B/P log in Dr Erich Montane mailbox and will also forward this note to both Dr Herbie Baltimore and Burt Knack RN   Nurse Visit   Date of Encounter: 11/14/2022 ID: [name], DOB 06/01/1961, MRN [id number]  PCP:  Delorse Lek, FNP   [facility] Providers Cardiologist:  Bryan Lemma, MD      Visit Details   VS:  BP 126/87   Pulse 73   Ht 6' (1.829 m)   Wt 260 lb (117.9 kg)   BMI 35.26 kg/m  , BMI Body mass index is 35.26 kg/m.  Wt Readings from Last 3 Encounters:  11/14/22 260 lb (117.9 kg)  11/06/22 260 lb 6.4 oz (118.1 kg)  06/23/22 260 lb (117.9 kg)     Reason for visit: B/P check  Performed today:b/p check  Changes (medications, testing, etc.) : no  changes Length of Visit: 5 minutes    Medications Adjustments/Labs and Tests Ordered: No orders of the defined types were placed in this encounter.  No orders of the defined types were placed in this encounter.    Jeanie Cooks, LPN  10/13/8117 1:47 PM

## 2022-11-18 ENCOUNTER — Emergency Department (HOSPITAL_COMMUNITY): Payer: BC Managed Care – PPO

## 2022-11-18 ENCOUNTER — Inpatient Hospital Stay (HOSPITAL_COMMUNITY)
Admission: EM | Disposition: A | Discharge status: Home / Self Care | Payer: Self-pay | Source: Home / Self Care | Attending: Cardiovascular Disease

## 2022-11-18 ENCOUNTER — Other Ambulatory Visit: Payer: Self-pay

## 2022-11-18 ENCOUNTER — Encounter (HOSPITAL_COMMUNITY): Payer: Self-pay

## 2022-11-18 ENCOUNTER — Inpatient Hospital Stay (HOSPITAL_COMMUNITY)
Admission: EM | Admit: 2022-11-18 | Discharge: 2022-11-20 | DRG: 322 | Disposition: A | Discharge status: Home / Self Care | Payer: BC Managed Care – PPO | Attending: Cardiology | Admitting: Cardiology

## 2022-11-18 DIAGNOSIS — Z8711 Personal history of peptic ulcer disease: Secondary | ICD-10-CM | POA: Diagnosis not present

## 2022-11-18 DIAGNOSIS — E785 Hyperlipidemia, unspecified: Secondary | ICD-10-CM | POA: Diagnosis present

## 2022-11-18 DIAGNOSIS — I951 Orthostatic hypotension: Secondary | ICD-10-CM | POA: Diagnosis not present

## 2022-11-18 DIAGNOSIS — Z79899 Other long term (current) drug therapy: Secondary | ICD-10-CM | POA: Diagnosis not present

## 2022-11-18 DIAGNOSIS — R7982 Elevated C-reactive protein (CRP): Secondary | ICD-10-CM | POA: Diagnosis not present

## 2022-11-18 DIAGNOSIS — Z8249 Family history of ischemic heart disease and other diseases of the circulatory system: Secondary | ICD-10-CM

## 2022-11-18 DIAGNOSIS — I498 Other specified cardiac arrhythmias: Secondary | ICD-10-CM | POA: Insufficient documentation

## 2022-11-18 DIAGNOSIS — K219 Gastro-esophageal reflux disease without esophagitis: Secondary | ICD-10-CM | POA: Diagnosis present

## 2022-11-18 DIAGNOSIS — Z86010 Personal history of colonic polyps: Secondary | ICD-10-CM | POA: Diagnosis not present

## 2022-11-18 DIAGNOSIS — I2111 ST elevation (STEMI) myocardial infarction involving right coronary artery: Secondary | ICD-10-CM | POA: Diagnosis not present

## 2022-11-18 DIAGNOSIS — I2510 Atherosclerotic heart disease of native coronary artery without angina pectoris: Secondary | ICD-10-CM | POA: Diagnosis not present

## 2022-11-18 DIAGNOSIS — I2119 ST elevation (STEMI) myocardial infarction involving other coronary artery of inferior wall: Principal | ICD-10-CM | POA: Diagnosis present

## 2022-11-18 DIAGNOSIS — R0789 Other chest pain: Secondary | ICD-10-CM

## 2022-11-18 DIAGNOSIS — I238 Other current complications following acute myocardial infarction: Secondary | ICD-10-CM | POA: Insufficient documentation

## 2022-11-18 DIAGNOSIS — Z9049 Acquired absence of other specified parts of digestive tract: Secondary | ICD-10-CM | POA: Diagnosis not present

## 2022-11-18 DIAGNOSIS — I319 Disease of pericardium, unspecified: Secondary | ICD-10-CM | POA: Diagnosis present

## 2022-11-18 DIAGNOSIS — M5440 Lumbago with sciatica, unspecified side: Secondary | ICD-10-CM | POA: Diagnosis present

## 2022-11-18 DIAGNOSIS — I10 Essential (primary) hypertension: Secondary | ICD-10-CM | POA: Diagnosis present

## 2022-11-18 DIAGNOSIS — Z87891 Personal history of nicotine dependence: Secondary | ICD-10-CM

## 2022-11-18 DIAGNOSIS — Z955 Presence of coronary angioplasty implant and graft: Secondary | ICD-10-CM

## 2022-11-18 DIAGNOSIS — Z87442 Personal history of urinary calculi: Secondary | ICD-10-CM

## 2022-11-18 DIAGNOSIS — R079 Chest pain, unspecified: Secondary | ICD-10-CM | POA: Diagnosis present

## 2022-11-18 HISTORY — PX: CORONARY STENT INTERVENTION: CATH118234

## 2022-11-18 HISTORY — PX: LEFT HEART CATH AND CORONARY ANGIOGRAPHY: CATH118249

## 2022-11-18 LAB — TROPONIN I (HIGH SENSITIVITY)
Troponin I (High Sensitivity): 6108 ng/L (ref <18)
Troponin I (High Sensitivity): 16533 ng/L (ref <18)

## 2022-11-18 LAB — CBC
HCT: 42.7 % (ref 39.0–52.0)
HCT: 44.6 % (ref 39.0–52.0)
Hemoglobin: 14.2 g/dL (ref 13.0–17.0)
Hemoglobin: 14.4 g/dL (ref 13.0–17.0)
MCH: 29.3 pg (ref 26.0–34.0)
MCH: 28.9 pg (ref 26.0–34.0)
MCHC: 33.3 g/dL (ref 30.0–36.0)
MCHC: 32.3 g/dL (ref 30.0–36.0)
MCV: 88.2 fL (ref 80.0–100.0)
MCV: 89.4 fL (ref 80.0–100.0)
Platelets: 250 10*3/uL (ref 150–400)
Platelets: 262 10*3/uL (ref 150–400)
RBC: 4.84 MIL/uL (ref 4.22–5.81)
RBC: 4.99 MIL/uL (ref 4.22–5.81)
RDW: 12.9 % (ref 11.5–15.5)
RDW: 12.8 % (ref 11.5–15.5)
WBC: 12.1 10*3/uL — ABNORMAL HIGH (ref 4.0–10.5)
WBC: 12.4 10*3/uL — ABNORMAL HIGH (ref 4.0–10.5)
nRBC: 0 % (ref 0.0–0.2)
nRBC: 0 % (ref 0.0–0.2)

## 2022-11-18 LAB — HEMOGLOBIN A1C
Hgb A1c MFr Bld: 5.8 % — ABNORMAL HIGH (ref 4.8–5.6)
Mean Plasma Glucose: 119.76 mg/dL

## 2022-11-18 LAB — CREATININE, SERUM
Creatinine, Ser: 0.84 mg/dL (ref 0.61–1.24)
GFR, Estimated: >60 mL/min (ref >60)

## 2022-11-18 LAB — ECHOCARDIOGRAM COMPLETE
Area-P 1/2: 4.18 cm2
Calc EF: 49.8 %
Height: 72 in
S' Lateral: 3.7 cm
Single Plane A2C EF: 49.3 %
Single Plane A4C EF: 49.3 %
Weight: 4158.76 oz

## 2022-11-18 LAB — BASIC METABOLIC PANEL
Anion gap: 11 (ref 5–15)
BUN: 14 mg/dL (ref 6–20)
CO2: 20 mmol/L — ABNORMAL LOW (ref 22–32)
Calcium: 9 mg/dL (ref 8.9–10.3)
Chloride: 105 mmol/L (ref 98–111)
Creatinine, Ser: 0.92 mg/dL (ref 0.61–1.24)
GFR, Estimated: >60 mL/min (ref >60)
Glucose, Bld: 103 mg/dL — ABNORMAL HIGH (ref 70–99)
Potassium: 3.5 mmol/L (ref 3.5–5.1)
Sodium: 136 mmol/L (ref 135–145)

## 2022-11-18 LAB — LIPID PANEL
Cholesterol: 129 mg/dL (ref 0–200)
HDL: 44 mg/dL (ref >40)
LDL Cholesterol: 72 mg/dL (ref 0–99)
Total CHOL/HDL Ratio: 2.9 RATIO
Triglycerides: 63 mg/dL (ref <150)
VLDL: 13 mg/dL (ref 0–40)

## 2022-11-18 LAB — HIV ANTIBODY (ROUTINE TESTING W REFLEX): HIV Screen 4th Generation wRfx: NONREACTIVE

## 2022-11-18 LAB — SEDIMENTATION RATE: Sed Rate: 40 mm/hr — ABNORMAL HIGH (ref 0–16)

## 2022-11-18 LAB — PROTIME-INR
INR: 1.1 (ref 0.8–1.2)
Prothrombin Time: 14.2 seconds (ref 11.4–15.2)

## 2022-11-18 LAB — C-REACTIVE PROTEIN: CRP: 11.6 mg/dL — ABNORMAL HIGH (ref <1.0)

## 2022-11-18 LAB — APTT: aPTT: 29 seconds (ref 24–36)

## 2022-11-18 SURGERY — LEFT HEART CATH AND CORONARY ANGIOGRAPHY
Anesthesia: LOCAL

## 2022-11-18 MED ORDER — LABETALOL HCL 5 MG/ML IV SOLN: 10.0000 mg | INTRAVENOUS | Status: AC | PRN

## 2022-11-18 MED ORDER — ASPIRIN 81 MG PO CHEW: 324.0000 mg | CHEWABLE_TABLET | ORAL | Status: AC

## 2022-11-18 MED ORDER — SODIUM CHLORIDE 0.9 % IV SOLN: INTRAVENOUS | Status: AC

## 2022-11-18 MED ORDER — ONDANSETRON HCL 4 MG/2ML IJ SOLN: 4.0000 mg | Freq: Four times a day (QID) | INTRAMUSCULAR | Status: DC | PRN

## 2022-11-18 MED ORDER — VERAPAMIL HCL 2.5 MG/ML IV SOLN
INTRAVENOUS | Status: AC
  Filled 2022-11-18: qty 2

## 2022-11-18 MED ORDER — HYDRALAZINE HCL 20 MG/ML IJ SOLN: 10.0000 mg | INTRAMUSCULAR | Status: AC | PRN

## 2022-11-18 MED ORDER — MIDAZOLAM HCL 2 MG/2ML IJ SOLN
INTRAMUSCULAR | Status: AC
  Filled 2022-11-18: qty 2

## 2022-11-18 MED ORDER — PERFLUTREN LIPID MICROSPHERE
1.0000 mL | INTRAVENOUS | Status: AC | PRN
  Administered 2022-11-18: 2 mL via INTRAVENOUS

## 2022-11-18 MED ORDER — LIDOCAINE HCL (PF) 1 % IJ SOLN
INTRAMUSCULAR | Status: AC
  Filled 2022-11-18: qty 30

## 2022-11-18 MED ORDER — LIDOCAINE HCL (PF) 1 % IJ SOLN
INTRAMUSCULAR | Status: DC | PRN
  Administered 2022-11-18: 2 mL via INTRADERMAL

## 2022-11-18 MED ORDER — HEPARIN (PORCINE) IN NACL 1000-0.9 UT/500ML-% IV SOLN
INTRAVENOUS | Status: DC | PRN
  Administered 2022-11-18 (×2): 500 mL

## 2022-11-18 MED ORDER — NITROGLYCERIN 1 MG/10 ML FOR IR/CATH LAB
INTRA_ARTERIAL | Status: DC | PRN
  Administered 2022-11-18: 150 ug
  Administered 2022-11-18: 100 ug

## 2022-11-18 MED ORDER — METOPROLOL TARTRATE 25 MG PO TABS
25.0000 mg | ORAL_TABLET | Freq: Two times a day (BID) | ORAL | Status: DC
  Administered 2022-11-18 – 2022-11-19 (×3): 25 mg via ORAL
  Filled 2022-11-18 (×3): qty 1

## 2022-11-18 MED ORDER — NITROGLYCERIN 1 MG/10 ML FOR IR/CATH LAB
INTRA_ARTERIAL | Status: AC
  Filled 2022-11-18: qty 10

## 2022-11-18 MED ORDER — NITROGLYCERIN 0.4 MG SL SUBL: 0.4000 mg | SUBLINGUAL_TABLET | SUBLINGUAL | Status: DC | PRN

## 2022-11-18 MED ORDER — MIDAZOLAM HCL 2 MG/2ML IJ SOLN
INTRAMUSCULAR | Status: DC | PRN
  Administered 2022-11-18: 2 mg via INTRAVENOUS

## 2022-11-18 MED ORDER — OXYCODONE HCL 5 MG PO TABS: 5.0000 mg | ORAL_TABLET | ORAL | Status: DC | PRN

## 2022-11-18 MED ORDER — ACETAMINOPHEN 325 MG PO TABS
650.0000 mg | ORAL_TABLET | ORAL | Status: DC | PRN
  Administered 2022-11-18: 650 mg via ORAL

## 2022-11-18 MED ORDER — ASPIRIN 300 MG RE SUPP
300.0000 mg | RECTAL | Status: AC
  Filled 2022-11-18: qty 1

## 2022-11-18 MED ORDER — ROSUVASTATIN CALCIUM 20 MG PO TABS
20.0000 mg | ORAL_TABLET | Freq: Every day | ORAL | Status: DC
  Administered 2022-11-19 – 2022-11-20 (×2): 20 mg via ORAL
  Filled 2022-11-18 (×2): qty 1

## 2022-11-18 MED ORDER — SODIUM CHLORIDE 0.9 % IV SOLN: INTRAVENOUS | Status: DC

## 2022-11-18 MED ORDER — HEPARIN SODIUM (PORCINE) 1000 UNIT/ML IJ SOLN
INTRAMUSCULAR | Status: AC
  Filled 2022-11-18: qty 10

## 2022-11-18 MED ORDER — ASPIRIN 81 MG PO TBEC
81.0000 mg | DELAYED_RELEASE_TABLET | Freq: Every day | ORAL | Status: DC
  Administered 2022-11-19 – 2022-11-20 (×2): 81 mg via ORAL
  Filled 2022-11-18 (×2): qty 1

## 2022-11-18 MED ORDER — SODIUM CHLORIDE 0.9 % IV SOLN: 250.0000 mL | INTRAVENOUS | Status: DC | PRN

## 2022-11-18 MED ORDER — TICAGRELOR 90 MG PO TABS
90.0000 mg | ORAL_TABLET | Freq: Two times a day (BID) | ORAL | Status: DC
  Administered 2022-11-18 – 2022-11-20 (×4): 90 mg via ORAL
  Filled 2022-11-18 (×4): qty 1

## 2022-11-18 MED ORDER — FENTANYL CITRATE (PF) 100 MCG/2ML IJ SOLN
INTRAMUSCULAR | Status: AC
  Filled 2022-11-18: qty 2

## 2022-11-18 MED ORDER — SODIUM CHLORIDE 0.9% FLUSH: 3.0000 mL | INTRAVENOUS | Status: DC | PRN

## 2022-11-18 MED ORDER — ACETAMINOPHEN 325 MG PO TABS
650.0000 mg | ORAL_TABLET | ORAL | Status: DC | PRN
  Filled 2022-11-18: qty 2

## 2022-11-18 MED ORDER — HEPARIN SODIUM (PORCINE) 5000 UNIT/ML IJ SOLN
4000.0000 [IU] | Freq: Once | INTRAMUSCULAR | Status: AC
  Administered 2022-11-18: 4000 [IU] via INTRAVENOUS
  Filled 2022-11-18: qty 1

## 2022-11-18 MED ORDER — TICAGRELOR 90 MG PO TABS
ORAL_TABLET | ORAL | Status: DC | PRN
  Administered 2022-11-18: 180 mg via ORAL

## 2022-11-18 MED ORDER — VERAPAMIL HCL 2.5 MG/ML IV SOLN
INTRAVENOUS | Status: DC | PRN
  Administered 2022-11-18: 10 mL via INTRA_ARTERIAL

## 2022-11-18 MED ORDER — ENOXAPARIN SODIUM 40 MG/0.4ML IJ SOSY
40.0000 mg | PREFILLED_SYRINGE | INTRAMUSCULAR | Status: DC
  Administered 2022-11-19 – 2022-11-20 (×2): 40 mg via SUBCUTANEOUS
  Filled 2022-11-18 (×2): qty 0.4

## 2022-11-18 MED ORDER — FENTANYL CITRATE (PF) 100 MCG/2ML IJ SOLN
INTRAMUSCULAR | Status: DC | PRN
  Administered 2022-11-18: 25 ug via INTRAVENOUS

## 2022-11-18 MED ORDER — ASPIRIN 81 MG PO CHEW
324.0000 mg | CHEWABLE_TABLET | Freq: Once | ORAL | Status: DC
  Filled 2022-11-18: qty 4

## 2022-11-18 MED ORDER — TICAGRELOR 90 MG PO TABS
ORAL_TABLET | ORAL | Status: AC
  Filled 2022-11-18: qty 2

## 2022-11-18 MED ORDER — MORPHINE SULFATE (PF) 2 MG/ML IV SOLN: 2.0000 mg | INTRAVENOUS | Status: DC | PRN

## 2022-11-18 MED ORDER — SODIUM CHLORIDE 0.9% FLUSH
3.0000 mL | Freq: Two times a day (BID) | INTRAVENOUS | Status: DC
  Administered 2022-11-19 – 2022-11-20 (×3): 3 mL via INTRAVENOUS

## 2022-11-18 MED ORDER — DIAZEPAM 5 MG PO TABS: 5.0000 mg | ORAL_TABLET | Freq: Three times a day (TID) | ORAL | Status: DC | PRN

## 2022-11-18 SURGICAL SUPPLY — 20 items
BALLN EMERGE MR 2.0X15 (BALLOONS) ×1
BALLN ~~LOC~~ EMERGE MR 3.5X12 (BALLOONS) ×1
BALLOON EMERGE MR 2.0X15 (BALLOONS) IMPLANT
BALLOON ~~LOC~~ EMERGE MR 3.5X12 (BALLOONS) IMPLANT
CATH 5FR JL3.5 JR4 ANG PIG MP (CATHETERS) IMPLANT
CATH LAUNCHER 6FR AL1 (CATHETERS) IMPLANT
CATHETER LAUNCHER 6FR AL1 (CATHETERS) ×1
DEVICE RAD COMP TR BAND LRG (VASCULAR PRODUCTS) IMPLANT
ELECT DEFIB PAD ADLT CADENCE (PAD) IMPLANT
GLIDESHEATH SLEND SS 6F .021 (SHEATH) IMPLANT
GUIDEWIRE INQWIRE 1.5J.035X260 (WIRE) IMPLANT
INQWIRE 1.5J .035X260CM (WIRE) ×1
KIT ENCORE 26 ADVANTAGE (KITS) IMPLANT
KIT HEART LEFT (KITS) ×1 IMPLANT
PACK CARDIAC CATHETERIZATION (CUSTOM PROCEDURE TRAY) ×1 IMPLANT
STENT SYNERGY XD 3.0X16 (Permanent Stent) IMPLANT
SYNERGY XD 3.0X16 (Permanent Stent) ×1 IMPLANT
TRANSDUCER W/STOPCOCK (MISCELLANEOUS) ×1 IMPLANT
TUBING CIL FLEX 10 FLL-RA (TUBING) ×1 IMPLANT
WIRE RUNTHROUGH .014X180CM (WIRE) IMPLANT

## 2022-11-18 NOTE — Progress Notes (Signed)
   Cardiology paged to patient after he reported onset of chest discomfort and ECG showed possible inferior STEMI. Patient s/p LHC with PCI to RCA. Temp also noted to be slightly elevated at 100.40F. I reviewed ECG along with Dr. Excell Seltzer (who performed patient's LHC today) and it was felt that overall ST morphology in inferior leads was stable. On my exam, patient reports that the pain he felt was midsternal, upper chest and worse when laying flat, improved and ultimately resolved by sitting up. He also feels like it was worsened with deep inspiration when in supine position. Confirms no active chest pain at the time of exam and appears comfortable. No diaphoresis or pallor noted. Patient not dyspneic. Suspect that this may be pericarditis. Will check ESR and CRP. Given that patient is without symptoms, will await lab results to initiate pericarditis anti-inflammatory medication.   Perlie Gold, PA-C

## 2022-11-18 NOTE — Progress Notes (Signed)
2D echo attempted, patient was moved to cath 1. Dr Excell Seltzer said echo could wait until post cath.

## 2022-11-18 NOTE — Progress Notes (Signed)
  Echocardiogram 2D Echocardiogram has been performed.  Janalyn Harder 11/18/2022, 2:45 PM

## 2022-11-18 NOTE — H&P (Signed)
Cardiology Admission History and Physical   Patient ID: Sahib Mcelhenny MRN: [id number]; DOB: 09-25-1960   Admission date: 11/18/2022  PCP:  Delorse Lek, FNP   [facility] Providers Cardiologist:  Bryan Lemma, MD        Chief Complaint:  Chest Pain  Patient Profile:   Caleb Mason is a 62 y.o. male who is being seen 11/18/2022 for the evaluation of chest pain/inferior MI.  History of Present Illness:   Mr. Alarcon is a 62 year old male who was recently seen for hypertension and atypical chest pain.  A coronary CTA was ordered, but not completed yet.  He reports that he has had intermittent chest discomfort in the center of the chest for the past month but episodes lasting briefly.  1 week ago he had a day of more severe pain associated with diaphoresis.  Since then he has had intermittent symptoms of central chest pressure and sharp pain and continued diaphoretic episodes.  Over the last few days he describes more pleuritic type pain only when taking a deep breath.  He went to his primary care physician office today and was noted to have an inferior STEMI pattern on his EKG but also with the presence of inferior Q waves.  EMS transport to the local hospital was recommended, but the patient insisted on coming to [facility] for care.  His wife drove him to the emergency room where he is evaluated in the triage area and is found to have an acute inferior STEMI pattern.  I immediately went to the emergency room to assess the patient.  He is chest pain-free at present.  States that his last symptoms were overnight.  No shortness of breath.  Continues to have some pleuritic type component of pain with deep breathing.  No further diaphoresis today.  No personal history of coronary artery disease, but notes a family history of CAD.  He denies lightheadedness, heart palpitations, or syncope.  He actually worked overnight doing Curator work.   Past Medical History:  Diagnosis Date    Adenomatous colon polyp 2013   6mm, tubular   Allergy    seasonal   Anemia    Broken hip (HCC) left   Chronic duodenal ulcer with gastric outlet obstruction 11/02/2018   Complication of anesthesia    got sick    Diverticulosis    Essential hypertension    At night for been given official diagnosis.   GERD (gastroesophageal reflux disease)    Impaired glucose regulation    Injury    Broken hip   Kidney stones    Lumbar back pain 2011   had hip fracture that caused lower back problems => has troubles with sciatica   Pancreatitis    PONV (postoperative nausea and vomiting)    Vitamin D deficiency     Past Surgical History:  Procedure Laterality Date   BIOPSY  09/10/2018   Procedure: BIOPSY;  Surgeon: Shellia Cleverly, DO;  Location: [facility];  Service: Endoscopy;;   CHOLECYSTECTOMY  2008   had pancreatitis   COLONOSCOPY W/ POLYPECTOMY     CYSTOSCOPY WITH RETROGRADE PYELOGRAM, URETEROSCOPY AND STENT PLACEMENT Right 09/15/2012   Procedure: CYSTOSCOPY WITH right  RETROGRADE PYELOGRAM, right  URETEROSCOPY AND STENT PLACEMENT;  Surgeon: Valetta Fuller, MD;  Location: [facility];  Service: Urology;  Laterality: Right;   ESOPHAGOGASTRODUODENOSCOPY (EGD) WITH PROPOFOL N/A 09/10/2018   Procedure: ESOPHAGOGASTRODUODENOSCOPY (EGD) WITH PROPOFOL;  Surgeon: Shellia Cleverly, DO;  Location: [facility];  Service: Endoscopy;  Laterality: N/A;   HOLMIUM LASER APPLICATION Right 09/15/2012   Procedure: HOLMIUM LASER APPLICATION;  Surgeon: Valetta Fuller, MD;  [facility];  Service: Urology;  Laterality: Right;   KNEE ARTHROSCOPY Right 1993   right     Medications Prior to Admission: Prior to Admission medications   Medication Sig Start Date End Date Taking? Authorizing Provider  metoprolol tartrate (LOPRESSOR) 50 MG tablet Take 2 hours before CT scan 11/06/22   Carlos Levering, NP  pantoprazole (PROTONIX) 40 MG tablet TAKE 1 TABLET BY MOUTH ABOUT  30 MINUTES BEFORE BREAKFAST Patient taking differently: as needed. 04/22/22   Iva Boop, MD  rosuvastatin (CRESTOR) 20 MG tablet Take 1 tablet (20 mg total) by mouth daily. 11/06/22 02/04/23  Carlos Levering, NP     Allergies:   No Known Allergies  Social History:   Social History   Socioeconomic History   Marital status: Married    Spouse name: Not on file   Number of children: 2   Years of education: Not on file   Highest education level: Associate degree: occupational, Scientist, product/process development, or vocational program  Occupational History   Occupation: Careers information officer  Tobacco Use   Smoking status: Former    Packs/day: 1.00    Years: 30.00    Additional pack years: 0.00    Total pack years: 30.00    Types: Cigarettes    Quit date: 09/15/2007    Years since quitting: 15.1   Smokeless tobacco: Never  Vaping Use   Vaping Use: Never used  Substance and Sexual Activity   Alcohol use: No    Alcohol/week: 0.0 standard drinks of alcohol   Drug use: No   Sexual activity: Yes    Partners: Female  Other Topics Concern   Not on file  Social History Narrative   Married with 2 sons; lives with his wife.  Identifies himself as a male.   Armed forces operational officer and farmer - [place]   4 caffeinated beverages qd   04/03/2015      Works as an Insurance account manager.  Oftentimes works extra shifts.  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      As of May 12, 2022: Exercises roughly 7 days a week for least an hour.   Has given up fast food, eating much better.  Giving up sweets.  Getting better sleep.  Trying to work better hours. => Has lost 15 pounds.  Also noted improvement in blood pressure from the 170s to 150/90 today.         Social Determinants of Health   Financial Resource Strain: Not on file  Food Insecurity: Not on file  Transportation Needs: Not on file  Physical Activity: Not on file  Stress: Not on file  Social Connections: Not on file   Intimate Partner Violence: Not on file    Family History:   The patient's family history includes Coronary artery disease (age of onset: 40) in his father; Esophageal cancer (age of onset: 38) in his father; Heart attack (age of onset: 67) in his paternal grandfather; Heart disease in his mother. There is no history of Colon cancer, Pancreatic cancer, Stomach cancer, Liver disease, or Colon polyps.    ROS:  Please see the history of present illness.  All other ROS reviewed and negative.     Physical Exam/Data:   Vitals:   11/18/22 1119 11/18/22 1123  BP: (!) 144/89   Pulse:  89   Resp: 16   Temp: 98.9 F (37.2 C)   SpO2: 96%   Weight:  117.9 kg  Height:  6' (1.829 m)   No intake or output data in the 24 hours ending 11/18/22 1215    11/18/2022   11:23 AM 11/14/2022    2:29 PM 11/06/2022    3:09 PM  Last 3 Weights  Weight (lbs) 259 lb 14.8 oz 260 lb 260 lb 6.4 oz  Weight (kg) 117.9 kg 117.935 kg 118.117 kg     Body mass index is 35.25 kg/m.  General:  Well nourished, well developed, in no acute distress HEENT: normal Neck: no JVD Vascular: No carotid bruits; Distal pulses 2+ bilaterally   Cardiac:  normal S1, S2; RRR; no murmur  Lungs:  clear to auscultation bilaterally, no wheezing, rhonchi or rales  Abd: soft, nontender, no hepatomegaly  Ext: no edema Musculoskeletal:  No deformities, BUE and BLE strength normal and equal Skin: warm and dry  Neuro:  CNs 2-12 intact, no focal abnormalities noted Psych:  Normal affect    EKG:  The ECG was personally reviewed and demonstrates normal sinus rhythm with acute inferoposterior infarct pattern  Relevant CV Studies:      Laboratory Data:  High Sensitivity Troponin:  No results for input(s): "TROPONINIHS" in the last 720 hours.    ChemistryNo results for input(s): "NA", "K", "CL", "CO2", "GLUCOSE", "BUN", "CREATININE", "CALCIUM", "MG", "GFRNONAA", "GFRAA", "ANIONGAP" in the last 168 hours.  No results for input(s):  "PROT", "ALBUMIN", "AST", "ALT", "ALKPHOS", "BILITOT" in the last 168 hours. Lipids No results for input(s): "CHOL", "TRIG", "HDL", "LABVLDL", "LDLCALC", "CHOLHDL" in the last 168 hours. HematologyNo results for input(s): "WBC", "RBC", "HGB", "HCT", "MCV", "MCH", "MCHC", "RDW", "PLT" in the last 168 hours. Thyroid No results for input(s): "TSH", "FREET4" in the last 168 hours. BNPNo results for input(s): "BNP", "PROBNP" in the last 168 hours.  DDimer No results for input(s): "DDIMER" in the last 168 hours.   Radiology/Studies:  No results found.   Assessment and Plan:   Acute inferoposterior STEMI, late presentation greater than 24 hours.  Patient currently chest pain-free.  Because of his late presentation and asymptomatic status at present, we will not activate a code STEMI.  However, the patient will undergo cardiac catheterization urgently to assess for coronary anatomy and revascularization options.  In the meantime, we will start him on IV heparin and initiate a beta-blocker with metoprolol 25 mg twice daily.  He does not appear to have a heart murmur on exam or any associated symptoms of heart failure.  However, with his late presenting infarct, will order a stat echo to rule out any mechanical complication. I have reviewed the risks, indications, and alternatives to cardiac catheterization, possible angioplasty, and stenting with the patient. Risks include but are not limited to bleeding, infection, vascular injury, stroke, myocardial infection, arrhythmia, kidney injury, radiation-related injury in the case of prolonged fluoroscopy use, emergency cardiac surgery, and death. The patient understands the risks of serious complication is 1-2 in 1000 with diagnostic cardiac cath and 1-2% or less with angioplasty/stenting.   Hypertension: Medications reviewed, will start him on metoprolol 25 mg twice daily Has been started on rosuvastatin 20 mg daily.  Will update lipids.  Disposition: Pending  cardiac catheterization results.  Plan discussed with patient and wife who is at bedside.   Risk Assessment/Risk Scores:    TIMI Risk Score for ST  Elevation MI:   The patient's TIMI risk score is 1,  which indicates a 1.6% risk of all cause mortality at 30 days.        Severity of Illness: The appropriate patient status for this patient is INPATIENT. Inpatient status is judged to be reasonable and necessary in order to provide the required intensity of service to ensure the patient's safety. The patient's presenting symptoms, physical exam findings, and initial radiographic and laboratory data in the context of their chronic comorbidities is felt to place them at high risk for further clinical deterioration. Furthermore, it is not anticipated that the patient will be medically stable for discharge from the hospital within 2 midnights of admission.   * I certify that at the point of admission it is my clinical judgment that the patient will require inpatient hospital care spanning beyond 2 midnights from the point of admission due to high intensity of service, high risk for further deterioration and high frequency of surveillance required.*   For questions or updates, please contact [facility] Please consult [email or web address] for contact info under     Signed, Tonny Bollman, MD  11/18/2022 12:15 PM

## 2022-11-18 NOTE — ED Notes (Signed)
EKG given to Dr. Doran Durand along with previous EKGs from outside facility.

## 2022-11-18 NOTE — Progress Notes (Signed)
Patient was complaining of chest soreness rating 1/10 in his upper chest. BP stable. Temp was 100.1 and patient was given tylenol. EKG was done- see results. This RN paged Mayford Knife PA and will be coming to the bedside to assess the patient. 1830 This RN checked on the patient and patient stated he did not have any more chest pain at this time.

## 2022-11-18 NOTE — ED Provider Notes (Signed)
[place] EMERGENCY DEPARTMENT AT [facility] Provider Note   CSN: [id number] Arrival date & time: 11/18/22  1110     History  Chief Complaint  Patient presents with   Chest Pain    Caleb Mason is a 62 y.o. male.  This is a 62 year old male presents with intermittent chest pain times several days.  Patient has no increasing dyspnea on exertion endorses fatigue..  Currently states he is pain-free currently.  Denies any prior history of coronary disease.  Called his doctor who told him to come in to be seen.       Home Medications Prior to Admission medications   Medication Sig Start Date End Date Taking? Authorizing Provider  metoprolol tartrate (LOPRESSOR) 50 MG tablet Take 2 hours before CT scan 11/06/22   Carlos Levering, NP  pantoprazole (PROTONIX) 40 MG tablet TAKE 1 TABLET BY MOUTH ABOUT 30 MINUTES BEFORE BREAKFAST Patient taking differently: as needed. 04/22/22   Iva Boop, MD  rosuvastatin (CRESTOR) 20 MG tablet Take 1 tablet (20 mg total) by mouth daily. 11/06/22 02/04/23  Carlos Levering, NP      Allergies    Patient has no known allergies.    Review of Systems   Review of Systems  All other systems reviewed and are negative.   Physical Exam Updated Vital Signs BP (!) 144/89   Pulse 89   Temp 98.9 F (37.2 C)   Resp 16   Ht 1.829 m (6')   Wt 117.9 kg   SpO2 96%   BMI 35.25 kg/m  Physical Exam Vitals and nursing note reviewed.  Constitutional:      General: He is not in acute distress.    Appearance: Normal appearance. He is well-developed. He is not toxic-appearing.  HENT:     Head: Normocephalic and atraumatic.  Eyes:     General: Lids are normal.     Conjunctiva/sclera: Conjunctivae normal.     Pupils: Pupils are equal, round, and reactive to light.  Neck:     Thyroid: No thyroid mass.     Trachea: No tracheal deviation.  Cardiovascular:     Rate and Rhythm: Normal rate and regular rhythm.     Heart sounds: Normal  heart sounds. No murmur heard.    No gallop.  Pulmonary:     Effort: Pulmonary effort is normal. No respiratory distress.     Breath sounds: Normal breath sounds. No stridor. No decreased breath sounds, wheezing, rhonchi or rales.  Abdominal:     General: There is no distension.     Palpations: Abdomen is soft.     Tenderness: There is no abdominal tenderness. There is no rebound.  Musculoskeletal:        General: No tenderness. Normal range of motion.     Cervical back: Normal range of motion and neck supple.  Skin:    General: Skin is warm and dry.     Findings: No abrasion or rash.  Neurological:     Mental Status: He is alert and oriented to person, place, and time. Mental status is at baseline.     GCS: GCS eye subscore is 4. GCS verbal subscore is 5. GCS motor subscore is 6.     Cranial Nerves: No cranial nerve deficit.     Sensory: No sensory deficit.     Motor: Motor function is intact.  Psychiatric:        Attention and Perception: Attention normal.        Speech:  Speech normal.        Behavior: Behavior normal.     ED Results / Procedures / Treatments   Labs (all labs ordered are listed, but only abnormal results are displayed) Labs Reviewed  BASIC METABOLIC PANEL  CBC  TROPONIN I (HIGH SENSITIVITY)    EKG EKG Interpretation  Date/Time:  Tuesday Nov 18 2022 11:32:34 EDT Ventricular Rate:  98 PR Interval:  140 QRS Duration: 95 QT Interval:  328 QTC Calculation: 419 R Axis:   -2 Text Interpretation: Sinus arrhythmia Abnormal R-wave progression, early transition Inferior infarct, acute (RCA) Probable RV involvement, suggest recording right precordial leads >>> Acute MI <<< Confirmed by Lorre Nick ([id number]) on 11/18/2022 11:37:02 AM  Radiology No results found.  Procedures Procedures    Medications Ordered in ED Medications  0.9 %  sodium chloride infusion (has no administration in time range)  aspirin chewable tablet 324 mg (has no administration in  time range)  heparin injection 4,000 Units (has no administration in time range)    ED Course/ Medical Decision Making/ A&P                             Medical Decision Making Amount and/or Complexity of Data Reviewed Labs: ordered. Radiology: ordered.  Risk OTC drugs. Prescription drug management.   Patient is EKG per my interpretation is consistent with inferior wall MI.  Patient is chest pain-free.  He also has significant Q waves present which indicates that he likely has MI several days ago.  Patient called code STEMI here on arrival.  Seen by Dr. Excell Seltzer from cardiology.  Patient will be admitted by that service and taken to the Cath Lab.  I have ordered heparin for the patient.        Final Clinical Impression(s) / ED Diagnoses Final diagnoses:  None    Rx / DC Orders ED Discharge Orders     None         Lorre Nick, MD 11/18/22 1138

## 2022-11-18 NOTE — Interval H&P Note (Signed)
History and Physical Interval Note:  11/18/2022 12:40 PM  Caleb Mason  has presented today for surgery, with the diagnosis of nstemi.  The various methods of treatment have been discussed with the patient and family. After consideration of risks, benefits and other options for treatment, the patient has consented to  Procedure(s): LEFT HEART CATH AND CORONARY ANGIOGRAPHY (N/A) as a surgical intervention.  The patient's history has been reviewed, patient examined, no change in status, stable for surgery.  I have reviewed the patient's chart and labs.  Questions were answered to the patient's satisfaction.     Tonny Bollman

## 2022-11-18 NOTE — ED Triage Notes (Signed)
Pt sent by PCP for irregular EKG and for what the PCP said was a STEMI. Pt c/o int midsternal chest painx2wks. Pt c/o SOB, chills and fatiguex2wks

## 2022-11-19 ENCOUNTER — Other Ambulatory Visit (HOSPITAL_COMMUNITY): Payer: Self-pay

## 2022-11-19 ENCOUNTER — Telehealth (HOSPITAL_COMMUNITY): Payer: Self-pay | Admitting: Pharmacy Technician

## 2022-11-19 ENCOUNTER — Encounter (HOSPITAL_COMMUNITY): Payer: Self-pay | Admitting: Cardiovascular Disease

## 2022-11-19 DIAGNOSIS — I2111 ST elevation (STEMI) myocardial infarction involving right coronary artery: Secondary | ICD-10-CM | POA: Diagnosis not present

## 2022-11-19 LAB — POCT ACTIVATED CLOTTING TIME: Activated Clotting Time: 304 seconds

## 2022-11-19 LAB — BASIC METABOLIC PANEL
Anion gap: 10 (ref 5–15)
BUN: 12 mg/dL (ref 6–20)
CO2: 22 mmol/L (ref 22–32)
Calcium: 8.7 mg/dL — ABNORMAL LOW (ref 8.9–10.3)
Chloride: 105 mmol/L (ref 98–111)
Creatinine, Ser: 0.79 mg/dL (ref 0.61–1.24)
GFR, Estimated: >60 mL/min (ref >60)
Glucose, Bld: 111 mg/dL — ABNORMAL HIGH (ref 70–99)
Potassium: 3.6 mmol/L (ref 3.5–5.1)
Sodium: 137 mmol/L (ref 135–145)

## 2022-11-19 LAB — LIPID PANEL
Cholesterol: 123 mg/dL (ref 0–200)
HDL: 35 mg/dL — ABNORMAL LOW (ref >40)
LDL Cholesterol: 70 mg/dL (ref 0–99)
Total CHOL/HDL Ratio: 3.5 RATIO
Triglycerides: 89 mg/dL (ref <150)
VLDL: 18 mg/dL (ref 0–40)

## 2022-11-19 LAB — CBC
HCT: 39.5 % (ref 39.0–52.0)
Hemoglobin: 13.2 g/dL (ref 13.0–17.0)
MCH: 29.5 pg (ref 26.0–34.0)
MCHC: 33.4 g/dL (ref 30.0–36.0)
MCV: 88.2 fL (ref 80.0–100.0)
Platelets: 244 10*3/uL (ref 150–400)
RBC: 4.48 MIL/uL (ref 4.22–5.81)
RDW: 13 % (ref 11.5–15.5)
WBC: 11.3 10*3/uL — ABNORMAL HIGH (ref 4.0–10.5)
nRBC: 0 % (ref 0.0–0.2)

## 2022-11-19 LAB — LIPOPROTEIN A (LPA): Lipoprotein (a): 87.9 nmol/L — ABNORMAL HIGH (ref <75.0)

## 2022-11-19 MED ORDER — COLCHICINE 0.6 MG PO TABS
0.6000 mg | ORAL_TABLET | Freq: Every day | ORAL | Status: DC
  Administered 2022-11-19 – 2022-11-20 (×2): 0.6 mg via ORAL
  Filled 2022-11-19 (×2): qty 1

## 2022-11-19 MED FILL — Heparin Sodium (Porcine) Inj 1000 Unit/ML: INTRAMUSCULAR | Qty: 10 | Status: AC

## 2022-11-19 NOTE — Telephone Encounter (Signed)
Pharmacy Patient Advocate Encounter  Insurance verification completed.    The patient is insured through [employer]   The patient is currently admitted and ran test claims for the following: Brilinta.  Copays and coinsurance results were relayed to Inpatient clinical team.  

## 2022-11-19 NOTE — Care Management (Signed)
  Transition of Care (TOC) Screening Note   Patient Details  Name: Caleb Mason Date of Birth: August 03, 1960   Transition of Care [facility]) CM/SW Contact:    Gala Lewandowsky, RN Phone Number: 11/19/2022, 9:31 AM    Transition of Care Department [facility]) has reviewed the patient and no TOC needs have been identified at this time. Benefits check for Brilinta has has been checked-cost  $30.00. No further home needs identified.

## 2022-11-19 NOTE — Progress Notes (Signed)
CARDIAC REHAB PHASE I   PRE:  Rate/Rhythm: 61 SR/JR with PVCs    BP: lying 104/59    SpO2: 97 RA  MODE:  Ambulation: 450 ft   POST:  Rate/Rhythm: 90 SR/JR    BP: sitting 101/53     SpO2: 98 RA  Pt eager to ambulate. He denied CP or SOB walking. His rhythm flipped in and out of JR, pt asx. No dizziness walking.   Pt had sudden "hot flash" with diaphoresis after 30 min of sitting in recliner. Asking when this is going to go away, given he has been having these "hot flashes" PTA. BP 88/60 now. Elevated pts feet and BP 91/65. Hot flash not correlated with JR.  Discussed with pt and wife MI, stent, Brilinta importance, restrictions, diet, exercise, NTG and CRPII. Many questions answered. Will refer to [facility] however pt will probably not be interested.  [id number]  Ethelda Chick BS, ACSM-CEP 11/19/2022 11:17 AM

## 2022-11-19 NOTE — TOC Benefit Eligibility Note (Signed)
Patient Product/process development scientist completed.    The patient is currently admitted and upon discharge could be taking Brilinta 90 mg.  The current 30 day co-pay is $30.00.   The patient is insured through [employer]   This test claim was processed through [place] [facility]- copay amounts may vary at other pharmacies due to pharmacy/plan contracts, or as the patient moves through the different stages of their insurance plan.  Roland Earl, CPHT Pharmacy Patient Advocate Specialist [facility] Patient Advocate Team Direct Number: [phone]  Fax: [phone]

## 2022-11-19 NOTE — Progress Notes (Addendum)
Rounding Note    Patient Name: Caleb Mason Date of Encounter: 11/19/2022  [facility] Cardiologist: Bryan Lemma, MD   Subjective   No significant complaints today.  Denies any chest pain, but still has some slight discomfort that has improved from yesterday.  Inpatient Medications    Scheduled Meds:  aspirin  324 mg Oral Once   aspirin  324 mg Oral NOW   Or   aspirin  300 mg Rectal NOW   aspirin EC  81 mg Oral Daily   enoxaparin (LOVENOX) injection  40 mg Subcutaneous Q24H   metoprolol tartrate  25 mg Oral BID   rosuvastatin  20 mg Oral Daily   sodium chloride flush  3 mL Intravenous Q12H   ticagrelor  90 mg Oral BID   Continuous Infusions:  sodium chloride Stopped (11/18/22 1208)   sodium chloride     PRN Meds: sodium chloride, acetaminophen, acetaminophen, diazepam, morphine injection, nitroGLYCERIN, ondansetron (ZOFRAN) IV, ondansetron (ZOFRAN) IV, oxyCODONE, sodium chloride flush   Vital Signs    Vitals:   11/18/22 1739 11/18/22 1940 11/19/22 0510 11/19/22 0852  BP: 122/79 115/70 120/71 108/69  Pulse:  86 73 80  Resp:  19 13 16   Temp: 100.1 F (37.8 C) 98.7 F (37.1 C) 98.7 F (37.1 C)   TempSrc: Oral Oral Oral   SpO2: 96% 96% 97%   Weight:      Height:        Intake/Output Summary (Last 24 hours) at 11/19/2022 1057 Last data filed at 11/19/2022 1047 Gross per 24 hour  Intake 387.33 ml  Output 900 ml  Net -512.67 ml      11/18/2022   11:23 AM 11/14/2022    2:29 PM 11/06/2022    3:09 PM  Last 3 Weights  Weight (lbs) 259 lb 14.8 oz 260 lb 260 lb 6.4 oz  Weight (kg) 117.9 kg 117.935 kg 118.117 kg      Telemetry    Normal sinus rhythm with frequent PACs, heart rate 73- Personally Reviewed  Also noted to have periodic changes in QRS complexes with intermittent junctional rhythms.   ECG    Repeat EKG still showing NSR, HR 80. ST elevations in inferior leads.  Unchanged from prior readings.  Slight ST depression in aVL. 1 PVC-  Personally Reviewed  Physical Exam   GEN: No acute distress.   Neck: No JVD Cardiac: RRR, no murmurs, rubs, or gallops.  Respiratory: Clear to auscultation bilaterally. GI: Soft, nontender, non-distended  MS: No edema; No deformity.  Right radial cath site inspected.  No evidence of redness, discharge, erythema. Neuro:  Nonfocal  Psych: Normal affect   Labs    High Sensitivity Troponin:   Recent Labs  Lab 11/18/22 1143 11/18/22 1547  TROPONINIHS 6,108* 16,533*     Chemistry Recent Labs  Lab 11/18/22 1143 11/18/22 1547 11/19/22 0316  NA 136  --  137  K 3.5  --  3.6  CL 105  --  105  CO2 20*  --  22  GLUCOSE 103*  --  111*  BUN 14  --  12  CREATININE 0.92 0.84 0.79  CALCIUM 9.0  --  8.7*  GFRNONAA >60 >60 >60  ANIONGAP 11  --  10    Lipids  Recent Labs  Lab 11/19/22 0316  CHOL 123  TRIG 89  HDL 35*  LDLCALC 70  CHOLHDL 3.5    Hematology Recent Labs  Lab 11/18/22 1143 11/18/22 1547 11/19/22 0316  WBC  12.1* 12.4* 11.3*  RBC 4.84 4.99 4.48  HGB 14.2 14.4 13.2  HCT 42.7 44.6 39.5  MCV 88.2 89.4 88.2  MCH 29.3 28.9 29.5  MCHC 33.3 32.3 33.4  RDW 12.9 12.8 13.0  PLT 250 262 244   Thyroid No results for input(s): "TSH", "FREET4" in the last 168 hours.  BNPNo results for input(s): "BNP", "PROBNP" in the last 168 hours.  DDimer No results for input(s): "DDIMER" in the last 168 hours.   Radiology    ECHOCARDIOGRAM COMPLETE  Result Date: 11/18/2022    ECHOCARDIOGRAM REPORT   Patient Name:   Mckaden Delon Date of Exam: 11/18/2022 Medical Rec #:  [id number]     Height:       72.0 in Accession #:    [id number]    Weight:       259.9 lb Date of Birth:  1961-06-13      BSA:          2.382 m Patient Age:    61 years      BP:           144/89 mmHg Patient Gender: M             HR:           81 bpm. Exam Location:  Inpatient Procedure: 2D Echo, Cardiac Doppler, Color Doppler and Intracardiac            Opacification Agent Indications:    R07.89 Other chest pain   History:        Patient has no prior history of Echocardiogram examinations.                 Acute MI and CAD; Risk Factors:Dyslipidemia.  Sonographer:    Sheralyn Boatman RDCS Referring Phys: Tonny Bollman  Sonographer Comments: Image acquisition challenging due to patient body habitus. IMPRESSIONS  1. Left ventricular ejection fraction, by estimation, is 50 to 55%. The left ventricle has low normal function. The left ventricle demonstrates regional wall motion abnormalities (see scoring diagram/findings for description). There is moderate concentric left ventricular hypertrophy. Left ventricular diastolic parameters are indeterminate.  2. Right ventricular systolic function is normal. The right ventricular size is normal.  3. The mitral valve is normal in structure. Mild mitral valve regurgitation. No evidence of mitral stenosis.  4. The aortic valve is normal in structure. Aortic valve regurgitation is not visualized. No aortic stenosis is present.  5. The inferior vena cava is normal in size with greater than 50% respiratory variability, suggesting right atrial pressure of 3 mmHg. FINDINGS  Left Ventricle: Left ventricular ejection fraction, by estimation, is 50 to 55%. The left ventricle has low normal function. The left ventricle demonstrates regional wall motion abnormalities. Definity contrast agent was given IV to delineate the left ventricular endocardial borders. The left ventricular internal cavity size was normal in size. There is moderate concentric left ventricular hypertrophy. Left ventricular diastolic parameters are indeterminate.  LV Wall Scoring: The basal inferolateral segment and basal inferior segment are akinetic. The entire anterior wall, antero-lateral wall, mid and distal lateral wall, entire septum, entire apex, and mid and distal inferior wall are hypokinetic. Right Ventricle: The right ventricular size is normal. No increase in right ventricular wall thickness. Right ventricular systolic  function is normal. Left Atrium: Left atrial size was normal in size. Right Atrium: Right atrial size was normal in size. Pericardium: There is no evidence of pericardial effusion. Presence of epicardial fat layer. Mitral Valve: The mitral valve is  normal in structure. Mild mitral valve regurgitation. No evidence of mitral valve stenosis. Tricuspid Valve: The tricuspid valve is normal in structure. Tricuspid valve regurgitation is not demonstrated. No evidence of tricuspid stenosis. Aortic Valve: The aortic valve is normal in structure. Aortic valve regurgitation is not visualized. No aortic stenosis is present. Pulmonic Valve: The pulmonic valve was normal in structure. Pulmonic valve regurgitation is not visualized. No evidence of pulmonic stenosis. Aorta: The aortic root is normal in size and structure. Venous: The inferior vena cava is normal in size with greater than 50% respiratory variability, suggesting right atrial pressure of 3 mmHg. IAS/Shunts: No atrial level shunt detected by color flow Doppler.  LEFT VENTRICLE PLAX 2D LVIDd:         4.80 cm      Diastology LVIDs:         3.70 cm      LV e' medial:    6.20 cm/s LV PW:         1.70 cm      LV E/e' medial:  14.1 LV IVS:        1.20 cm      LV e' lateral:   11.20 cm/s LVOT diam:     2.30 cm      LV E/e' lateral: 7.8 LV SV:         83 LV SV Index:   35 LVOT Area:     4.15 cm  LV Volumes (MOD) LV vol d, MOD A2C: 134.0 ml LV vol d, MOD A4C: 147.0 ml LV vol s, MOD A2C: 68.0 ml LV vol s, MOD A4C: 74.6 ml LV SV MOD A2C:     66.0 ml LV SV MOD A4C:     147.0 ml LV SV MOD BP:      71.1 ml RIGHT VENTRICLE            IVC RV S prime:     9.90 cm/s  IVC diam: 2.10 cm TAPSE (M-mode): 2.5 cm LEFT ATRIUM             Index        RIGHT ATRIUM           Index LA diam:        4.40 cm 1.85 cm/m   RA Area:     13.10 cm LA Vol (A2C):   57.3 ml 24.06 ml/m  RA Volume:   26.30 ml  11.04 ml/m LA Vol (A4C):   66.7 ml 28.01 ml/m LA Biplane Vol: 65.3 ml 27.42 ml/m  AORTIC VALVE              PULMONIC VALVE LVOT Vmax:   109.00 cm/s PR End Diast Vel: 1.64 msec LVOT Vmean:  70.500 cm/s LVOT VTI:    0.200 m  AORTA Ao Root diam: 3.50 cm Ao Asc diam:  3.50 cm MITRAL VALVE MV Area (PHT): 4.18 cm    SHUNTS MV Decel Time: 182 msec    Systemic VTI:  0.20 m MV E velocity: 87.20 cm/s  Systemic Diam: 2.30 cm MV A velocity: 87.80 cm/s MV E/A ratio:  0.99 Kardie Tobb DO Electronically signed by Thomasene Ripple DO Signature Date/Time: 11/18/2022/4:02:34 PM    Final    CARDIAC CATHETERIZATION  Result Date: 11/18/2022   Dist RCA lesion is 100% stenosed.   Prox RCA lesion is 30% stenosed.   Prox Cx to Mid Cx lesion is 40% stenosed.   Prox LAD to Mid LAD lesion is 30% stenosed.  A drug-eluting stent was successfully placed using a SYNERGY XD 3.0X16.   Post intervention, there is a 0% residual stenosis.   The left ventricular systolic function is normal.   LV end diastolic pressure is mildly elevated.   The left ventricular ejection fraction is 50-55% by visual estimate. 1.  Patent left main with no obstructive disease 2.  Patent LAD to the apex with mild proximal and mid vessel plaquing and stenosis up to 30 to 40% 3.  Patent left circumflex with nonobstructive plaquing of 30 to 40% extending from the proximal circumflex into the first OM 4.  Total occlusion of the distal RCA, treated successfully with PCI using a 3.0 x 16 mm Synergy DES, postdilated with a 3.5 mm noncompliant balloon.  100% occlusion and TIMI 0 flow at baseline, 0% residual stenosis and TIMI-3 flow post PCI 5.  Poorly visualized endocardial borders, but apparent normal LV EF estimated at 50 to 55% with hypokinesis of the inferior wall Recommendations: 2D echo for better assessment of post-MI LV function, ASA and ticagrelor x 12 months without interruption, post-MI medical therapy. Consider fast track DC tomorrow if remains medically stable.   DG Chest 2 View  Result Date: 11/18/2022 CLINICAL DATA:  Provided history: Intermittent chest  pain. EXAM: CHEST - 2 VIEW COMPARISON:  Prior chest radiographs 11/09/2018 and earlier. FINDINGS: Heart size within normal limits. No appreciable airspace consolidation. No evidence of pleural effusion or pneumothorax. No acute osseous abnormality identified. Degenerative changes of the spine. IMPRESSION: No evidence of active cardiopulmonary disease. Electronically Signed   By: Jackey Loge D.O.   On: 11/18/2022 12:36    Cardiac Studies   Echocardiogram 11/18/2022 1. Left ventricular ejection fraction, by estimation, is 50 to 55%. The  left ventricle has low normal function. The left ventricle demonstrates  regional wall motion abnormalities (see scoring diagram/findings for  description). There is moderate  concentric left ventricular hypertrophy. Left ventricular diastolic  parameters are indeterminate.   2. Right ventricular systolic function is normal. The right ventricular  size is normal.   3. The mitral valve is normal in structure. Mild mitral valve  regurgitation. No evidence of mitral stenosis.   4. The aortic valve is normal in structure. Aortic valve regurgitation is  not visualized. No aortic stenosis is present.   5. The inferior vena cava is normal in size with greater than 50%  respiratory variability, suggesting right atrial pressure of 3 mmHg.   FINDINGS   Left Ventricle: Left ventricular ejection fraction, by estimation, is 50  to 55%. The left ventricle has low normal function. The left ventricle  demonstrates regional wall motion abnormalities. Definity contrast agent  was given IV to delineate the left  ventricular endocardial borders. The left ventricular internal cavity size  was normal in size. There is moderate concentric left ventricular  hypertrophy. Left ventricular diastolic parameters are indeterminate.   Left heart catheterization 11/18/2022   Dist RCA lesion is 100% stenosed.   Prox RCA lesion is 30% stenosed.   Prox Cx to Mid Cx lesion is 40%  stenosed.   Prox LAD to Mid LAD lesion is 30% stenosed.   A drug-eluting stent was successfully placed using a SYNERGY XD 3.0X16.   Post intervention, there is a 0% residual stenosis.   The left ventricular systolic function is normal.   LV end diastolic pressure is mildly elevated.   The left ventricular ejection fraction is 50-55% by visual estimate.   1.  Patent left main with no  obstructive disease 2.  Patent LAD to the apex with mild proximal and mid vessel plaquing and stenosis up to 30 to 40% 3.  Patent left circumflex with nonobstructive plaquing of 30 to 40% extending from the proximal circumflex into the first OM 4.  Total occlusion of the distal RCA, treated successfully with PCI using a 3.0 x 16 mm Synergy DES, postdilated with a 3.5 mm noncompliant balloon.  100% occlusion and TIMI 0 flow at baseline, 0% residual stenosis and TIMI-3 flow post PCI 5.  Poorly visualized endocardial borders, but apparent normal LV EF estimated at 50 to 55% with hypokinesis of the inferior wall   Recommendations: 2D echo for better assessment of post-MI LV function, ASA and ticagrelor x 12 months without interruption, post-MI medical therapy. Consider fast track DC tomorrow if remains medically stable.    Patient Profile     62 y.o. male with history of hypertension presented with inferior STEMI.  Underwent cardiac catheterization yesterday on 11/18/2022 and had stent placed to his RCA.  Subsequently patient developed chest pain postprocedure with symptoms consistent with pericarditis and consistent with lab work. 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  Patent LAD and patent LAD to the apex with mild proximal and mid vessel plaque of 30 to 40%.  Patent left circumflex with nonobstructing plaque 30 to 40%.  He had total occlusion of the RCA and had stent placed with TIMI-3 flow post PCI.   Follow-up echocardiogram indicating LVEF of 50 to 55% with regional wall motion abnormalities and moderate concentric LVH.  Patient without any heart failure symptoms at this time. Currently chest pain-free.   Continue DAPT with aspirin 81 mg daily, Brilinta 90 mg twice daily uninterrupted for the next 12 months Continue Lopressor 25 twice daily and rosuvastatin 20 mg daily Likely able to discharge today.  Will get patient up and moving and see how he feels. Will continue to risk stratify risk factors for CAD.  A1c 5.8. Patient with normal LVEF at this time, can recheck echocardiogram in 3 months.  Pericarditis Postprocedure patient developed chest pain which seemed to be consistent with pericarditis.  CRP elevated at 11.6, sed rate elevated at 40.  Has had improvement in chest pain since yesterday Will treat with colchicine 0.6 mg daily x 6 weeks and if pain persists can do longer, but will hold off on NSAID due to increased risk of bleeding.  No evidence of effusion on echo. Has home protonix to help with GI side effects too.   Hypertension Well-controlled throughout this admission.  Continue medications as above.  Hyperlipidemia LDL 70.  Continue rosuvastatin 20 mg daily Will order LP(a)  Addendum: patient feeling hot and flush during walk with cardiac rehab. Will check orthostatics and hold off on second dose of lopressor. May be able to DC tomorrow if feeling better.   For questions or updates, please contact [facility] Please consult [email or web address] for contact info under        Signed, Abagail Kitchens, PA-C  11/19/2022, 10:57 AM

## 2022-11-20 ENCOUNTER — Other Ambulatory Visit (HOSPITAL_COMMUNITY): Payer: Self-pay

## 2022-11-20 ENCOUNTER — Telehealth: Payer: Self-pay | Admitting: Cardiology

## 2022-11-20 ENCOUNTER — Telehealth: Payer: Self-pay | Admitting: *Deleted

## 2022-11-20 ENCOUNTER — Encounter: Payer: Self-pay | Admitting: Cardiology

## 2022-11-20 DIAGNOSIS — I498 Other specified cardiac arrhythmias: Secondary | ICD-10-CM | POA: Insufficient documentation

## 2022-11-20 DIAGNOSIS — I2510 Atherosclerotic heart disease of native coronary artery without angina pectoris: Secondary | ICD-10-CM | POA: Insufficient documentation

## 2022-11-20 DIAGNOSIS — I319 Disease of pericardium, unspecified: Secondary | ICD-10-CM | POA: Insufficient documentation

## 2022-11-20 DIAGNOSIS — I2111 ST elevation (STEMI) myocardial infarction involving right coronary artery: Secondary | ICD-10-CM | POA: Diagnosis not present

## 2022-11-20 DIAGNOSIS — I238 Other current complications following acute myocardial infarction: Secondary | ICD-10-CM | POA: Insufficient documentation

## 2022-11-20 DIAGNOSIS — E785 Hyperlipidemia, unspecified: Secondary | ICD-10-CM | POA: Insufficient documentation

## 2022-11-20 LAB — CBC
HCT: 41.2 % (ref 39.0–52.0)
Hemoglobin: 13.6 g/dL (ref 13.0–17.0)
MCH: 28.9 pg (ref 26.0–34.0)
MCHC: 33 g/dL (ref 30.0–36.0)
MCV: 87.5 fL (ref 80.0–100.0)
Platelets: 280 10*3/uL (ref 150–400)
RBC: 4.71 MIL/uL (ref 4.22–5.81)
RDW: 12.7 % (ref 11.5–15.5)
WBC: 9.9 10*3/uL (ref 4.0–10.5)
nRBC: 0 % (ref 0.0–0.2)

## 2022-11-20 LAB — BASIC METABOLIC PANEL
Anion gap: 12 (ref 5–15)
BUN: 11 mg/dL (ref 6–20)
CO2: 21 mmol/L — ABNORMAL LOW (ref 22–32)
Calcium: 8.7 mg/dL — ABNORMAL LOW (ref 8.9–10.3)
Chloride: 103 mmol/L (ref 98–111)
Creatinine, Ser: 0.79 mg/dL (ref 0.61–1.24)
GFR, Estimated: >60 mL/min (ref >60)
Glucose, Bld: 100 mg/dL — ABNORMAL HIGH (ref 70–99)
Potassium: 3.5 mmol/L (ref 3.5–5.1)
Sodium: 136 mmol/L (ref 135–145)

## 2022-11-20 MED ORDER — COLCHICINE 0.6 MG PO TABS
0.6000 mg | ORAL_TABLET | Freq: Every day | ORAL | 0 refills | Status: AC
  Filled 2022-11-20: qty 30, 30d supply, fill #0

## 2022-11-20 MED ORDER — ASPIRIN 81 MG PO TBEC
81.0000 mg | DELAYED_RELEASE_TABLET | Freq: Every day | ORAL | 12 refills | Status: AC
  Filled 2022-11-20: qty 120, 120d supply, fill #0

## 2022-11-20 MED ORDER — NITROGLYCERIN 0.4 MG SL SUBL
0.4000 mg | SUBLINGUAL_TABLET | SUBLINGUAL | 2 refills | Status: AC | PRN
  Filled 2022-11-20: qty 25, 30d supply, fill #0

## 2022-11-20 MED ORDER — TICAGRELOR 90 MG PO TABS
90.0000 mg | ORAL_TABLET | Freq: Two times a day (BID) | ORAL | 11 refills | Status: AC
  Filled 2022-11-20: qty 60, 30d supply, fill #0

## 2022-11-20 MED ORDER — POTASSIUM CHLORIDE CRYS ER 20 MEQ PO TBCR
40.0000 meq | EXTENDED_RELEASE_TABLET | Freq: Once | ORAL | Status: AC
  Administered 2022-11-20: 40 meq via ORAL
  Filled 2022-11-20: qty 2

## 2022-11-20 NOTE — Progress Notes (Signed)
Rounding Note    Patient Name: Caleb Mason Date of Encounter: 11/20/2022  [facility] Cardiologist: Bryan Lemma, MD   Subjective   No significant complaints today.  Denies any chest pain.  Inpatient Medications    Scheduled Meds:  aspirin  324 mg Oral Once   aspirin EC  81 mg Oral Daily   colchicine  0.6 mg Oral Daily   enoxaparin (LOVENOX) injection  40 mg Subcutaneous Q24H   rosuvastatin  20 mg Oral Daily   sodium chloride flush  3 mL Intravenous Q12H   ticagrelor  90 mg Oral BID   Continuous Infusions:  sodium chloride Stopped (11/18/22 1208)   sodium chloride     PRN Meds: sodium chloride, acetaminophen, acetaminophen, diazepam, morphine injection, nitroGLYCERIN, ondansetron (ZOFRAN) IV, ondansetron (ZOFRAN) IV, oxyCODONE, sodium chloride flush   Vital Signs    Vitals:   11/19/22 0852 11/19/22 1446 11/19/22 1954 11/20/22 0500  BP: 108/69 99/62 113/71 128/83  Pulse: 80 77 88 79  Resp: 16 19 12 17   Temp:  98.7 F (37.1 C) 99.3 F (37.4 C) 98.2 F (36.8 C)  TempSrc:  Oral Oral Oral  SpO2:   97% 97%  Weight:      Height:        Intake/Output Summary (Last 24 hours) at 11/20/2022 0809 Last data filed at 11/19/2022 1300 Gross per 24 hour  Intake --  Output 400 ml  Net -400 ml       11/18/2022   11:23 AM 11/14/2022    2:29 PM 11/06/2022    3:09 PM  Last 3 Weights  Weight (lbs) 259 lb 14.8 oz 260 lb 260 lb 6.4 oz  Weight (kg) 117.9 kg 117.935 kg 118.117 kg      Telemetry    Normal sinus rhythm with frequent PACs, heart rate 70s. Has some intermittent dropped beats- Personally Reviewed   ECG    Last EKG still showing NSR, HR 80. ST elevations in inferior leads.  Unchanged from prior readings.  Slight ST depression in aVL. 1 PVC- Personally Reviewed  Physical Exam   GEN: No acute distress.   Neck: No JVD Cardiac: RRR, no murmurs, rubs, or gallops.  Respiratory: Clear to auscultation bilaterally. GI: Soft, nontender, non-distended   MS: No edema; No deformity.  Right radial cath site inspected.  No evidence of redness, discharge, erythema. Neuro:  Nonfocal  Psych: Normal affect   Labs    High Sensitivity Troponin:   Recent Labs  Lab 11/18/22 1143 11/18/22 1547  TROPONINIHS 6,108* 16,533*      Chemistry Recent Labs  Lab 11/18/22 1143 11/18/22 1547 11/19/22 0316 11/20/22 0251  NA 136  --  137 136  K 3.5  --  3.6 3.5  CL 105  --  105 103  CO2 20*  --  22 21*  GLUCOSE 103*  --  111* 100*  BUN 14  --  12 11  CREATININE 0.92 0.84 0.79 0.79  CALCIUM 9.0  --  8.7* 8.7*  GFRNONAA >60 >60 >60 >60  ANIONGAP 11  --  10 12     Lipids  Recent Labs  Lab 11/19/22 0316  CHOL 123  TRIG 89  HDL 35*  LDLCALC 70  CHOLHDL 3.5     Hematology Recent Labs  Lab 11/18/22 1547 11/19/22 0316 11/20/22 0251  WBC 12.4* 11.3* 9.9  RBC 4.99 4.48 4.71  HGB 14.4 13.2 13.6  HCT 44.6 39.5 41.2  MCV 89.4 88.2 87.5  MCH  28.9 29.5 28.9  MCHC 32.3 33.4 33.0  RDW 12.8 13.0 12.7  PLT 262 244 280    Thyroid No results for input(s): "TSH", "FREET4" in the last 168 hours.  BNPNo results for input(s): "BNP", "PROBNP" in the last 168 hours.  DDimer No results for input(s): "DDIMER" in the last 168 hours.   Radiology    ECHOCARDIOGRAM COMPLETE  Result Date: 11/18/2022    ECHOCARDIOGRAM REPORT   Patient Name:   Caleb Mason Date of Exam: 11/18/2022 Medical Rec #:  [id number]     Height:       72.0 in Accession #:    [id number]    Weight:       259.9 lb Date of Birth:  March 07, 1961      BSA:          2.382 m Patient Age:    61 years      BP:           144/89 mmHg Patient Gender: M             HR:           81 bpm. Exam Location:  Inpatient Procedure: 2D Echo, Cardiac Doppler, Color Doppler and Intracardiac            Opacification Agent Indications:    R07.89 Other chest pain  History:        Patient has no prior history of Echocardiogram examinations.                 Acute MI and CAD; Risk Factors:Dyslipidemia.  Sonographer:     Sheralyn Boatman RDCS Referring Phys: Tonny Bollman  Sonographer Comments: Image acquisition challenging due to patient body habitus. IMPRESSIONS  1. Left ventricular ejection fraction, by estimation, is 50 to 55%. The left ventricle has low normal function. The left ventricle demonstrates regional wall motion abnormalities (see scoring diagram/findings for description). There is moderate concentric left ventricular hypertrophy. Left ventricular diastolic parameters are indeterminate.  2. Right ventricular systolic function is normal. The right ventricular size is normal.  3. The mitral valve is normal in structure. Mild mitral valve regurgitation. No evidence of mitral stenosis.  4. The aortic valve is normal in structure. Aortic valve regurgitation is not visualized. No aortic stenosis is present.  5. The inferior vena cava is normal in size with greater than 50% respiratory variability, suggesting right atrial pressure of 3 mmHg. FINDINGS  Left Ventricle: Left ventricular ejection fraction, by estimation, is 50 to 55%. The left ventricle has low normal function. The left ventricle demonstrates regional wall motion abnormalities. Definity contrast agent was given IV to delineate the left ventricular endocardial borders. The left ventricular internal cavity size was normal in size. There is moderate concentric left ventricular hypertrophy. Left ventricular diastolic parameters are indeterminate.  LV Wall Scoring: The basal inferolateral segment and basal inferior segment are akinetic. The entire anterior wall, antero-lateral wall, mid and distal lateral wall, entire septum, entire apex, and mid and distal inferior wall are hypokinetic. Right Ventricle: The right ventricular size is normal. No increase in right ventricular wall thickness. Right ventricular systolic function is normal. Left Atrium: Left atrial size was normal in size. Right Atrium: Right atrial size was normal in size. Pericardium: There is no evidence of  pericardial effusion. Presence of epicardial fat layer. Mitral Valve: The mitral valve is normal in structure. Mild mitral valve regurgitation. No evidence of mitral valve stenosis. Tricuspid Valve: The tricuspid valve is normal in structure. Tricuspid valve  regurgitation is not demonstrated. No evidence of tricuspid stenosis. Aortic Valve: The aortic valve is normal in structure. Aortic valve regurgitation is not visualized. No aortic stenosis is present. Pulmonic Valve: The pulmonic valve was normal in structure. Pulmonic valve regurgitation is not visualized. No evidence of pulmonic stenosis. Aorta: The aortic root is normal in size and structure. Venous: The inferior vena cava is normal in size with greater than 50% respiratory variability, suggesting right atrial pressure of 3 mmHg. IAS/Shunts: No atrial level shunt detected by color flow Doppler.  LEFT VENTRICLE PLAX 2D LVIDd:         4.80 cm      Diastology LVIDs:         3.70 cm      LV e' medial:    6.20 cm/s LV PW:         1.70 cm      LV E/e' medial:  14.1 LV IVS:        1.20 cm      LV e' lateral:   11.20 cm/s LVOT diam:     2.30 cm      LV E/e' lateral: 7.8 LV SV:         83 LV SV Index:   35 LVOT Area:     4.15 cm  LV Volumes (MOD) LV vol d, MOD A2C: 134.0 ml LV vol d, MOD A4C: 147.0 ml LV vol s, MOD A2C: 68.0 ml LV vol s, MOD A4C: 74.6 ml LV SV MOD A2C:     66.0 ml LV SV MOD A4C:     147.0 ml LV SV MOD BP:      71.1 ml RIGHT VENTRICLE            IVC RV S prime:     9.90 cm/s  IVC diam: 2.10 cm TAPSE (M-mode): 2.5 cm LEFT ATRIUM             Index        RIGHT ATRIUM           Index LA diam:        4.40 cm 1.85 cm/m   RA Area:     13.10 cm LA Vol (A2C):   57.3 ml 24.06 ml/m  RA Volume:   26.30 ml  11.04 ml/m LA Vol (A4C):   66.7 ml 28.01 ml/m LA Biplane Vol: 65.3 ml 27.42 ml/m  AORTIC VALVE             PULMONIC VALVE LVOT Vmax:   109.00 cm/s PR End Diast Vel: 1.64 msec LVOT Vmean:  70.500 cm/s LVOT VTI:    0.200 m  AORTA Ao Root diam: 3.50 cm Ao  Asc diam:  3.50 cm MITRAL VALVE MV Area (PHT): 4.18 cm    SHUNTS MV Decel Time: 182 msec    Systemic VTI:  0.20 m MV E velocity: 87.20 cm/s  Systemic Diam: 2.30 cm MV A velocity: 87.80 cm/s MV E/A ratio:  0.99 Kardie Tobb DO Electronically signed by Thomasene Ripple DO Signature Date/Time: 11/18/2022/4:02:34 PM    Final    CARDIAC CATHETERIZATION  Result Date: 11/18/2022   Dist RCA lesion is 100% stenosed.   Prox RCA lesion is 30% stenosed.   Prox Cx to Mid Cx lesion is 40% stenosed.   Prox LAD to Mid LAD lesion is 30% stenosed.   A drug-eluting stent was successfully placed using a SYNERGY XD 3.0X16.   Post intervention, there is a 0% residual stenosis.  The left ventricular systolic function is normal.   LV end diastolic pressure is mildly elevated.   The left ventricular ejection fraction is 50-55% by visual estimate. 1.  Patent left main with no obstructive disease 2.  Patent LAD to the apex with mild proximal and mid vessel plaquing and stenosis up to 30 to 40% 3.  Patent left circumflex with nonobstructive plaquing of 30 to 40% extending from the proximal circumflex into the first OM 4.  Total occlusion of the distal RCA, treated successfully with PCI using a 3.0 x 16 mm Synergy DES, postdilated with a 3.5 mm noncompliant balloon.  100% occlusion and TIMI 0 flow at baseline, 0% residual stenosis and TIMI-3 flow post PCI 5.  Poorly visualized endocardial borders, but apparent normal LV EF estimated at 50 to 55% with hypokinesis of the inferior wall Recommendations: 2D echo for better assessment of post-MI LV function, ASA and ticagrelor x 12 months without interruption, post-MI medical therapy. Consider fast track DC tomorrow if remains medically stable.   DG Chest 2 View  Result Date: 11/18/2022 CLINICAL DATA:  Provided history: Intermittent chest pain. EXAM: CHEST - 2 VIEW COMPARISON:  Prior chest radiographs 11/09/2018 and earlier. FINDINGS: Heart size within normal limits. No appreciable airspace  consolidation. No evidence of pleural effusion or pneumothorax. No acute osseous abnormality identified. Degenerative changes of the spine. IMPRESSION: No evidence of active cardiopulmonary disease. Electronically Signed   By: Jackey Loge D.O.   On: 11/18/2022 12:36    Cardiac Studies   Echocardiogram 11/18/2022 1. Left ventricular ejection fraction, by estimation, is 50 to 55%. The  left ventricle has low normal function. The left ventricle demonstrates  regional wall motion abnormalities (see scoring diagram/findings for  description). There is moderate  concentric left ventricular hypertrophy. Left ventricular diastolic  parameters are indeterminate.   2. Right ventricular systolic function is normal. The right ventricular  size is normal.   3. The mitral valve is normal in structure. Mild mitral valve  regurgitation. No evidence of mitral stenosis.   4. The aortic valve is normal in structure. Aortic valve regurgitation is  not visualized. No aortic stenosis is present.   5. The inferior vena cava is normal in size with greater than 50%  respiratory variability, suggesting right atrial pressure of 3 mmHg.   FINDINGS   Left Ventricle: Left ventricular ejection fraction, by estimation, is 50  to 55%. The left ventricle has low normal function. The left ventricle  demonstrates regional wall motion abnormalities. Definity contrast agent  was given IV to delineate the left  ventricular endocardial borders. The left ventricular internal cavity size  was normal in size. There is moderate concentric left ventricular  hypertrophy. Left ventricular diastolic parameters are indeterminate.   Left heart catheterization 11/18/2022   Dist RCA lesion is 100% stenosed.   Prox RCA lesion is 30% stenosed.   Prox Cx to Mid Cx lesion is 40% stenosed.   Prox LAD to Mid LAD lesion is 30% stenosed.   A drug-eluting stent was successfully placed using a SYNERGY XD 3.0X16.   Post intervention, there is a  0% residual stenosis.   The left ventricular systolic function is normal.   LV end diastolic pressure is mildly elevated.   The left ventricular ejection fraction is 50-55% by visual estimate.   1.  Patent left main with no obstructive disease 2.  Patent LAD to the apex with mild proximal and mid vessel plaquing and stenosis up to 30 to 40%  3.  Patent left circumflex with nonobstructive plaquing of 30 to 40% extending from the proximal circumflex into the first OM 4.  Total occlusion of the distal RCA, treated successfully with PCI using a 3.0 x 16 mm Synergy DES, postdilated with a 3.5 mm noncompliant balloon.  100% occlusion and TIMI 0 flow at baseline, 0% residual stenosis and TIMI-3 flow post PCI 5.  Poorly visualized endocardial borders, but apparent normal LV EF estimated at 50 to 55% with hypokinesis of the inferior wall   Recommendations: 2D echo for better assessment of post-MI LV function, ASA and ticagrelor x 12 months without interruption, post-MI medical therapy. Consider fast track DC tomorrow if remains medically stable.    Patient Profile     62 y.o. male with history of hypertension presented with inferior STEMI.  Underwent cardiac catheterization yesterday on 11/18/2022 and had stent placed to his RCA.  Subsequently patient developed chest pain postprocedure with symptoms consistent with pericarditis and consistent with lab work. 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  Patent LAD and patent LAD to the apex with mild proximal and mid vessel plaque of 30 to 40%.  Patent left circumflex with nonobstructing plaque 30 to 40%.  He had total occlusion of the RCA and had stent placed with TIMI-3 flow post PCI.  Follow-up echocardiogram indicating LVEF of 50 to 55% with regional wall motion abnormalities and moderate concentric LVH.  Patient without any heart failure symptoms  at this time. Currently chest pain-free.   Continue DAPT with aspirin 81 mg daily, Brilinta 90 mg twice daily uninterrupted for the next 12 months Lopressor 25mg  was stopped yesterday due to complaints of hot flashes when ambulating yesterday with cardiac rehab.  Patient states that this has been going on for the last 2 weeks and thinks it is associated with lower blood pressures in the 110s.  Patient is reluctant to continue medication due to these hot flashes and concerns of hypotension.  I discussed with patient that the metoprolol dosing is not likely to significantly affect blood pressure and that there could be other underlying etiologies for his hot flashes/hypotension.  He would like this discussed with MD about either discontinuing or reducing dose. Likely able to discharge today.  Patient has been walking the halls and feels good and feels he is ready for discharge. Will continue to risk stratify risk factors for CAD.  A1c 5.8.  Pericarditis Postprocedure patient developed chest pain which seemed to be consistent with pericarditis.  CRP elevated at 11.6, sed rate elevated at 40.  Will treat with colchicine 0.6 mg daily x 6 weeks and if pain persists can do longer, but will hold off on NSAID due to increased risk of bleeding.  No evidence of effusion on echo. Has home protonix to help with GI side effects too.   Hypertension Controlled.  See above.  Hyperlipidemia LDL 70.  Continue rosuvastatin 20 mg daily LP(a) 87.9  Orthostatic hypotension Orthostatics were negative.  Junctional rhythm  Telemetry throughout this admission showing shifting axis and intermittent junctional rhythm. No correlation with symptom and no clinical significance right now. Continue to monitor. Will hold off BB per MD.   For questions or updates, please contact [facility] Please consult [email or web address] for contact info under        Signed, Abagail Kitchens, PA-C  11/20/2022, 8:09 AM

## 2022-11-20 NOTE — Telephone Encounter (Signed)
   Transition of Care Follow-up Phone Call Request    Patient Name: Caleb Mason Date of Birth: 1960/09/23 Date of Encounter: 11/20/2022  Primary Care Provider:  Delorse Lek, FNP Primary Cardiologist:  Bryan Lemma, MD  [name] has been scheduled for a transition of care follow up appointment with a [facility] provider: With Edd Fabian on 12/02/2022.   Please reach out to [facility] within 48 hours to confirm appointment.  Abagail Kitchens, PA-C  11/20/2022, 11:24 AM

## 2022-11-20 NOTE — Progress Notes (Signed)
CARDIAC REHAB PHASE I   PRE:  Rate/Rhythm: 95 NSR  BP:  Sitting: 141/86      SaO2: 97 RA  MODE:  Ambulation: 470 ft   AD:  no AD  POST:  Rate/Rhythm: 103 ST  BP:  Sitting: 121/75      SaO2: 98 RA  Pt amb with supervision assistance, pt denies CP and SOB during amb and was returned to room w/o complaint. Answered family questions about HH diet. Pt denies additional questions.   Faustino Congress  ACSM-CEP 8:39 AM 11/20/2022    Service time is from 0800 to 0839.

## 2022-11-20 NOTE — Discharge Summary (Signed)
Discharge Summary    Patient ID: Caleb Mason MRN: [id number]; DOB: [birth year]  Admit date: 11/18/2022 Discharge date: 11/20/2022  PCP:  Delorse Lek, FNP   [facility] Providers Cardiologist:  Bryan Lemma, MD   {   Discharge Diagnoses    Principal Problem:   Acute ST elevation myocardial infarction (STEMI) of inferior wall [facility]) Active Problems:   Essential hypertension   Hyperlipidemia   Pericarditis as complication of acute myocardial infarction [facility])   Junctional rhythm   CAD (coronary artery disease)    Diagnostic Studies/Procedures    Echocardiogram 11/18/2022 1. Left ventricular ejection fraction, by estimation, is 50 to 55%. The  left ventricle has low normal function. The left ventricle demonstrates  regional wall motion abnormalities (see scoring diagram/findings for  description). There is moderate  concentric left ventricular hypertrophy. Left ventricular diastolic  parameters are indeterminate.   2. Right ventricular systolic function is normal. The right ventricular  size is normal.   3. The mitral valve is normal in structure. Mild mitral valve  regurgitation. No evidence of mitral stenosis.   4. The aortic valve is normal in structure. Aortic valve regurgitation is  not visualized. No aortic stenosis is present.   5. The inferior vena cava is normal in size with greater than 50%  respiratory variability, suggesting right atrial pressure of 3 mmHg.   FINDINGS   Left Ventricle: Left ventricular ejection fraction, by estimation, is 50  to 55%. The left ventricle has low normal function. The left ventricle  demonstrates regional wall motion abnormalities. Definity contrast agent  was given IV to delineate the left  ventricular endocardial borders. The left ventricular internal cavity size  was normal in size. There is moderate concentric left ventricular  hypertrophy. Left ventricular diastolic parameters are indeterminate.    Left heart  catheterization 11/18/2022   Dist RCA lesion is 100% stenosed.   Prox RCA lesion is 30% stenosed.   Prox Cx to Mid Cx lesion is 40% stenosed.   Prox LAD to Mid LAD lesion is 30% stenosed.   A drug-eluting stent was successfully placed using a SYNERGY XD 3.0X16.   Post intervention, there is a 0% residual stenosis.   The left ventricular systolic function is normal.   LV end diastolic pressure is mildly elevated.   The left ventricular ejection fraction is 50-55% by visual estimate.   1.  Patent left main with no obstructive disease 2.  Patent LAD to the apex with mild proximal and mid vessel plaquing and stenosis up to 30 to 40% 3.  Patent left circumflex with nonobstructive plaquing of 30 to 40% extending from the proximal circumflex into the first OM 4.  Total occlusion of the distal RCA, treated successfully with PCI using a 3.0 x 16 mm Synergy DES, postdilated with a 3.5 mm noncompliant balloon.  100% occlusion and TIMI 0 flow at baseline, 0% residual stenosis and TIMI-3 flow post PCI 5.  Poorly visualized endocardial borders, but apparent normal LV EF estimated at 50 to 55% with hypokinesis of the inferior wall   Recommendations: 2D echo for better assessment of post-MI LV function, ASA and ticagrelor x 12 months without interruption, post-MI medical therapy. Consider fast track DC tomorrow if remains medically stable.  _____________   History of Present Illness     Caleb Mason is a 62 y.o. male with history of hypertension and atypical chest pain. He was recently seen in the office for intermittent chest discomfort for the previous month  and coronary CTA was pending. He had worsening episodes the week leading up to admission that was associated with severe pain and diaphoresis. He presented to his primary care physician's office and was noted to have an inferior STEMI pattern on EKG but also inferior Q waves. EMS transport to the local hospital was recommended, but the patient insisted  on coming to [facility] for care. His wife drove him to the emergency room here where EKG confirmed acute inferior STEMI pattern. It was suspected he had a late presenting MI.  Because of his late presentation and asymptomatic status at presence code STEMI was not activated however patient did undergo urgent cardiac catheterization.    Hospital Course     Inferior STEMI status post DES to RCA Underwent cardiac catheterization which showed patent nonobstructive disease to the left main.  Patent LAD and patent LAD to the apex with mild proximal and mid vessel plaque of 30 to 40%.  Patent left circumflex with nonobstructing plaque 30 to 40%.  He had total occlusion of the RCA and had stent placed with TIMI-3 flow post PCI.  Follow-up echocardiogram indicating LVEF of 50 to 55% with regional wall motion abnormalities and moderate concentric LVH.  Patient without any heart failure symptoms at this time. Currently chest pain-free. Troponins peaked at 16,533.   Continue DAPT with aspirin 81 mg daily, Brilinta 90 mg twice daily uninterrupted for the next 12 months Stopped lopressor due to hot flashes and hypotension, intermittent junctional rhythm on telemetry (with preserved HR) Continue rosuvastatin 20 mg daily Will continue to risk stratify risk factors for CAD.  A1c 5.8.  Pericarditis Postprocedure patient developed chest pain with low grade fever of 100.1 which seemed to be consistent with pericarditis.  CRP elevated at 11.6, sed rate elevated at 40.  Has had improvement in chest pain since yesterday Will treat with colchicine 0.6 mg daily x 6 weeks and if pain persists can do longer, but will hold off on NSAID due to increased risk of bleeding. Lower dose chosen due to interaction with statin. No evidence of effusion on echo. Has home protonix to help with GI side effects too.    Hypertension Well-controlled throughout this admission. Episode of hypotension noted on 5/15 with cardiac rehab, Lopressor  discontinued. Patient will be focusing on lifestyle modifications.  He is checking his blood pressure regularly and will follow-up with provider.    Hyperlipidemia LDL 70.  Continue rosuvastatin 20 mg daily. Stopped home OTC fish oil given bleeding risk and statin/colchicine interaction. LP(a) 87.9  Orthostatic hypotension Noted by cardiac rehab; f/u formal orthostatics were negative. Resolved at discharge.   Junctional rhythm  Telemetry throughout this admission showing shifting axis and intermittent junctional rhythm. HR remained preserved in the 80s without bradycardia. No correlation with symptom and no clinical significance right now. Continue to monitor. Will hold off BB per MD.   Dr. [place] has seen and evaluated the patient and feels he is stable for discharge. He is to remain out of work until cleared in follow-up; work note given.       Did the patient have an acute coronary syndrome (MI, NSTEMI, STEMI, etc) this admission?:  Yes                               AHA/ACC Clinical Performance & Quality Measures: Aspirin prescribed? - Yes ADP Receptor Inhibitor (Plavix/Clopidogrel, Brilinta/Ticagrelor or Effient/Prasugrel) prescribed (includes medically managed patients)? - Yes Beta  Blocker prescribed? - No - did not tolerate due to hot flashes  High Intensity Statin (Lipitor 40-80mg  or Crestor 20-40mg ) prescribed? - Yes EF assessed during THIS hospitalization? - Yes For EF <40%, was ACEI/ARB prescribed? - Not Applicable (EF >/= 40%) For EF <40%, Aldosterone Antagonist (Spironolactone or Eplerenone) prescribed? - Not Applicable (EF >/= 40%) Cardiac Rehab Phase II ordered (including medically managed patients)? - Yes       The patient will be scheduled for a TOC follow up appointment within 14 days.  A message has been sent to the [facility] Pool at the office where the patient should be seen for follow up.  _____________  Discharge Vitals Blood pressure (!) 115/58, pulse 78,  temperature 98.2 F (36.8 C), temperature source Oral, resp. rate 16, height 6' (1.829 m), weight 117.9 kg, SpO2 97 %.  Filed Weights   11/18/22 1123  Weight: 117.9 kg    Labs & Radiologic Studies    CBC Recent Labs    11/19/22 0316 11/20/22 0251  WBC 11.3* 9.9  HGB 13.2 13.6  HCT 39.5 41.2  MCV 88.2 87.5  PLT 244 280   Basic Metabolic Panel Recent Labs    40/98/11 0316 11/20/22 0251  NA 137 136  K 3.6 3.5  CL 105 103  CO2 22 21*  GLUCOSE 111* 100*  BUN 12 11  CREATININE 0.79 0.79  CALCIUM 8.7* 8.7*   Liver Function Tests No results for input(s): "AST", "ALT", "ALKPHOS", "BILITOT", "PROT", "ALBUMIN" in the last 72 hours. No results for input(s): "LIPASE", "AMYLASE" in the last 72 hours. High Sensitivity Troponin:   Recent Labs  Lab 11/18/22 1143 11/18/22 1547  TROPONINIHS 6,108* 16,533*    BNP Invalid input(s): "POCBNP" D-Dimer No results for input(s): "DDIMER" in the last 72 hours. Hemoglobin A1C Recent Labs    11/18/22 1143  HGBA1C 5.8*   Fasting Lipid Panel Recent Labs    11/19/22 0316  CHOL 123  HDL 35*  LDLCALC 70  TRIG 89  CHOLHDL 3.5   Thyroid Function Tests No results for input(s): "TSH", "T4TOTAL", "T3FREE", "THYROIDAB" in the last 72 hours.  Invalid input(s): "FREET3" _____________  ECHOCARDIOGRAM COMPLETE  Result Date: 11/18/2022    ECHOCARDIOGRAM REPORT   Patient Name:   Ahadu Skahan Date of Exam: 11/18/2022 Medical Rec #:  [id number]     Height:       72.0 in Accession #:    [id number]    Weight:       259.9 lb Date of Birth:  Jul 24, 1960      BSA:          2.382 m Patient Age:    61 years      BP:           144/89 mmHg Patient Gender: M             HR:           81 bpm. Exam Location:  Inpatient Procedure: 2D Echo, Cardiac Doppler, Color Doppler and Intracardiac            Opacification Agent Indications:    R07.89 Other chest pain  History:        Patient has no prior history of Echocardiogram examinations.                 Acute MI  and CAD; Risk Factors:Dyslipidemia.  Sonographer:    Sheralyn Boatman RDCS Referring Phys: Tonny Bollman  Sonographer Comments: Image acquisition challenging due to patient body  habitus. IMPRESSIONS  1. Left ventricular ejection fraction, by estimation, is 50 to 55%. The left ventricle has low normal function. The left ventricle demonstrates regional wall motion abnormalities (see scoring diagram/findings for description). There is moderate concentric left ventricular hypertrophy. Left ventricular diastolic parameters are indeterminate.  2. Right ventricular systolic function is normal. The right ventricular size is normal.  3. The mitral valve is normal in structure. Mild mitral valve regurgitation. No evidence of mitral stenosis.  4. The aortic valve is normal in structure. Aortic valve regurgitation is not visualized. No aortic stenosis is present.  5. The inferior vena cava is normal in size with greater than 50% respiratory variability, suggesting right atrial pressure of 3 mmHg. FINDINGS  Left Ventricle: Left ventricular ejection fraction, by estimation, is 50 to 55%. The left ventricle has low normal function. The left ventricle demonstrates regional wall motion abnormalities. Definity contrast agent was given IV to delineate the left ventricular endocardial borders. The left ventricular internal cavity size was normal in size. There is moderate concentric left ventricular hypertrophy. Left ventricular diastolic parameters are indeterminate.  LV Wall Scoring: The basal inferolateral segment and basal inferior segment are akinetic. The entire anterior wall, antero-lateral wall, mid and distal lateral wall, entire septum, entire apex, and mid and distal inferior wall are hypokinetic. Right Ventricle: The right ventricular size is normal. No increase in right ventricular wall thickness. Right ventricular systolic function is normal. Left Atrium: Left atrial size was normal in size. Right Atrium: Right atrial size was  normal in size. Pericardium: There is no evidence of pericardial effusion. Presence of epicardial fat layer. Mitral Valve: The mitral valve is normal in structure. Mild mitral valve regurgitation. No evidence of mitral valve stenosis. Tricuspid Valve: The tricuspid valve is normal in structure. Tricuspid valve regurgitation is not demonstrated. No evidence of tricuspid stenosis. Aortic Valve: The aortic valve is normal in structure. Aortic valve regurgitation is not visualized. No aortic stenosis is present. Pulmonic Valve: The pulmonic valve was normal in structure. Pulmonic valve regurgitation is not visualized. No evidence of pulmonic stenosis. Aorta: The aortic root is normal in size and structure. Venous: The inferior vena cava is normal in size with greater than 50% respiratory variability, suggesting right atrial pressure of 3 mmHg. IAS/Shunts: No atrial level shunt detected by color flow Doppler.  LEFT VENTRICLE PLAX 2D LVIDd:         4.80 cm      Diastology LVIDs:         3.70 cm      LV e' medial:    6.20 cm/s LV PW:         1.70 cm      LV E/e' medial:  14.1 LV IVS:        1.20 cm      LV e' lateral:   11.20 cm/s LVOT diam:     2.30 cm      LV E/e' lateral: 7.8 LV SV:         83 LV SV Index:   35 LVOT Area:     4.15 cm  LV Volumes (MOD) LV vol d, MOD A2C: 134.0 ml LV vol d, MOD A4C: 147.0 ml LV vol s, MOD A2C: 68.0 ml LV vol s, MOD A4C: 74.6 ml LV SV MOD A2C:     66.0 ml LV SV MOD A4C:     147.0 ml LV SV MOD BP:      71.1 ml RIGHT VENTRICLE  IVC RV S prime:     9.90 cm/s  IVC diam: 2.10 cm TAPSE (M-mode): 2.5 cm LEFT ATRIUM             Index        RIGHT ATRIUM           Index LA diam:        4.40 cm 1.85 cm/m   RA Area:     13.10 cm LA Vol (A2C):   57.3 ml 24.06 ml/m  RA Volume:   26.30 ml  11.04 ml/m LA Vol (A4C):   66.7 ml 28.01 ml/m LA Biplane Vol: 65.3 ml 27.42 ml/m  AORTIC VALVE             PULMONIC VALVE LVOT Vmax:   109.00 cm/s PR End Diast Vel: 1.64 msec LVOT Vmean:  70.500 cm/s  LVOT VTI:    0.200 m  AORTA Ao Root diam: 3.50 cm Ao Asc diam:  3.50 cm MITRAL VALVE MV Area (PHT): 4.18 cm    SHUNTS MV Decel Time: 182 msec    Systemic VTI:  0.20 m MV E velocity: 87.20 cm/s  Systemic Diam: 2.30 cm MV A velocity: 87.80 cm/s MV E/A ratio:  0.99 Kardie Tobb DO Electronically signed by Thomasene Ripple DO Signature Date/Time: 11/18/2022/4:02:34 PM    Final    CARDIAC CATHETERIZATION  Result Date: 11/18/2022   Dist RCA lesion is 100% stenosed.   Prox RCA lesion is 30% stenosed.   Prox Cx to Mid Cx lesion is 40% stenosed.   Prox LAD to Mid LAD lesion is 30% stenosed.   A drug-eluting stent was successfully placed using a SYNERGY XD 3.0X16.   Post intervention, there is a 0% residual stenosis.   The left ventricular systolic function is normal.   LV end diastolic pressure is mildly elevated.   The left ventricular ejection fraction is 50-55% by visual estimate. 1.  Patent left main with no obstructive disease 2.  Patent LAD to the apex with mild proximal and mid vessel plaquing and stenosis up to 30 to 40% 3.  Patent left circumflex with nonobstructive plaquing of 30 to 40% extending from the proximal circumflex into the first OM 4.  Total occlusion of the distal RCA, treated successfully with PCI using a 3.0 x 16 mm Synergy DES, postdilated with a 3.5 mm noncompliant balloon.  100% occlusion and TIMI 0 flow at baseline, 0% residual stenosis and TIMI-3 flow post PCI 5.  Poorly visualized endocardial borders, but apparent normal LV EF estimated at 50 to 55% with hypokinesis of the inferior wall Recommendations: 2D echo for better assessment of post-MI LV function, ASA and ticagrelor x 12 months without interruption, post-MI medical therapy. Consider fast track DC tomorrow if remains medically stable.   DG Chest 2 View  Result Date: 11/18/2022 CLINICAL DATA:  Provided history: Intermittent chest pain. EXAM: CHEST - 2 VIEW COMPARISON:  Prior chest radiographs 11/09/2018 and earlier. FINDINGS: Heart size  within normal limits. No appreciable airspace consolidation. No evidence of pleural effusion or pneumothorax. No acute osseous abnormality identified. Degenerative changes of the spine. IMPRESSION: No evidence of active cardiopulmonary disease. Electronically Signed   By: Jackey Loge D.O.   On: 11/18/2022 12:36   Disposition   Pt is being discharged home today in good condition.  Follow-up Plans & Appointments     Follow-up Information     Ronney Asters, NP Follow up.   Specialty: Cardiology Why: [facility] location -  cardiology follow-up arranged on Tuesday Dec 02, 2022 at 1:55 PM (Arrive by 1:40 PM). Contact information: [address] [place] [address] [phone]                Discharge Instructions     Amb Referral to Cardiac Rehabilitation   Complete by: As directed    Diagnosis:  Coronary Stents STEMI PTCA     After initial evaluation and assessments completed: Virtual Based Care may be provided alone or in conjunction with Phase 2 Cardiac Rehab based on patient barriers.: Yes   Intensive Cardiac Rehabilitation (ICR) [facility] location only OR Traditional Cardiac Rehabilitation (TCR) *If criteria for ICR are not met will enroll in TCR [facility] only): Yes   Diet - low sodium heart healthy   Complete by: As directed    Discharge instructions   Complete by: As directed    No driving for 2 weeks. No lifting over 10 lbs for 4 weeks. No sexual activity for 4 weeks. You may not return to work until cleared by your cardiologist. Keep procedure site clean & dry. If you notice increased pain, swelling, bleeding or pus, call/return!  You may shower, but no soaking baths/hot tubs/pools for 1 week.   Increase activity slowly   Complete by: As directed         Discharge Medications   Allergies as of 11/20/2022   No Known Allergies      Medication List     STOP taking these medications    Fish Oil 1000 MG Caps       TAKE these  medications    aspirin EC 81 MG tablet Take 1 tablet (81 mg total) by mouth daily. Swallow whole. Start taking on: Nov 21, 2022   colchicine 0.6 MG tablet Take 1 tablet (0.6 mg total) by mouth daily. For 6 weeks total. Start taking on: Nov 21, 2022   nitroGLYCERIN 0.4 MG SL tablet Commonly known as: NITROSTAT Place 1 tablet (0.4 mg total) under the tongue every 5 (five) minutes x 3 doses as needed for chest pain.   pantoprazole 40 MG tablet Commonly known as: PROTONIX TAKE 1 TABLET BY MOUTH ABOUT 30 MINUTES BEFORE BREAKFAST What changed: See the new instructions.   rosuvastatin 20 MG tablet Commonly known as: CRESTOR Take 1 tablet (20 mg total) by mouth daily.   ticagrelor 90 MG Tabs tablet Commonly known as: BRILINTA Take 1 tablet (90 mg total) by mouth 2 (two) times daily.           Outstanding Labs/Studies   N/A  Duration of Discharge Encounter   Greater than 30 minutes including physician time.  Signed, Abagail Kitchens, PA-C 11/20/2022, 11:15 AM

## 2022-11-20 NOTE — Telephone Encounter (Signed)
-----   Message from Laurann [place], [place] sent at 11/20/2022 10:40 AM EDT ----- Regarding: Cancel coronary CT scan Hi triage team, this patient needs their cor CT scan cancelled - had cath inpatient instead. Can you help cancel and remove from work queue? Thanks! Dayna

## 2022-11-20 NOTE — Progress Notes (Addendum)
Discharge instructions reviewed with pt and his wife. Cath site education reviewed and handout provided.  Copy of instructions given to pt. [facility] filled scripts for pt, will pick up scripts on way out to discharge pt. Pt d/c'd via wheelchair with belongings, with wife.         Escorted by staff.  Jammie Troup,RN SWOT

## 2022-11-20 NOTE — Telephone Encounter (Signed)
patient had a left heart cath 11/19/22 , this test is not needed at present time per D.Dunn PA --  CCTA has been cancelled.

## 2022-11-20 NOTE — Telephone Encounter (Signed)
Patient is currently admitted 11/20/22. Will ned to be called  11/21/22

## 2022-11-21 ENCOUNTER — Ambulatory Visit (HOSPITAL_COMMUNITY): Payer: BC Managed Care – PPO

## 2022-11-21 ENCOUNTER — Telehealth (HOSPITAL_COMMUNITY): Payer: Self-pay

## 2022-11-21 NOTE — Telephone Encounter (Signed)
Pt returning nurse's call. Please advise

## 2022-11-21 NOTE — Telephone Encounter (Signed)
Left voicemail to return call to office.

## 2022-11-21 NOTE — Telephone Encounter (Signed)
Per Phase 1 Cardiac Rehab fax Referral to [facility].

## 2022-11-21 NOTE — Telephone Encounter (Signed)
Patient contacted regarding discharge from [facility] on 11/21/2022.  Patient understands to follow up with provider Edd Fabian on 12/02/2022 at 01:55 pm} at [facility]. Patient understands discharge instructions? yes Patient understands medications and regiment? yes  Patient understands to bring all medications to this visit? yes  Ask patient:  Are you enrolled in My Chart yes

## 2022-11-24 ENCOUNTER — Telehealth: Payer: Self-pay | Admitting: Cardiology

## 2022-11-24 NOTE — Telephone Encounter (Signed)
11/24/22   - FMLA forms copy put in [facility] box for upcoming appt on 5/28.   - Patient has NOT completed release, billing, or payment for forms.

## 2022-11-28 ENCOUNTER — Telehealth: Payer: Self-pay | Admitting: Cardiology

## 2022-11-28 NOTE — Telephone Encounter (Signed)
Returned call to patients wife who states that patient had a nose bleed today that was not gushing blood and was on and off for 15 minutes. Patients wife states that patient has been having allergy type symptoms and reports a cough as well. Advised her to have patient saline nose spray to keep nasal passages moist, as well as advising her that patient can use otc cough medication that does not contain sudafed (confirmed with PharmD). Patients wife reports patients BP has been running 120's/80's. Advised her to continue to monitor and gave her ER precautions should new or worsening symptoms develop. Patients wife verbalized understanding.

## 2022-11-28 NOTE — Progress Notes (Unsigned)
Cardiology Clinic Note   Patient Name: Caleb Mason Date of Encounter: 12/02/2022  Primary Care Provider:  Delorse Lek, FNP Primary Cardiologist:  Bryan Lemma, MD  Patient Profile     Caleb Mason 62 year old male presents to the clinic today for follow-up evaluation of his essential hypertension.  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   GERD (gastroesophageal reflux disease)    Impaired glucose regulation    Injury    Broken hip   Kidney stones    Lumbar back pain 2011   had hip fracture that caused lower back problems => has troubles with sciatica   Pancreatitis    PONV (postoperative nausea and vomiting)    Vitamin D deficiency    Past Surgical History:  Procedure Laterality Date   BIOPSY  09/10/2018   Procedure: BIOPSY;  Surgeon: Shellia Cleverly, DO;  Location: [facility];  Service: Endoscopy;;   CHOLECYSTECTOMY  2008   had pancreatitis   COLONOSCOPY W/ POLYPECTOMY     CORONARY STENT INTERVENTION N/A 11/18/2022   Procedure: CORONARY STENT INTERVENTION;  Surgeon: Tonny Bollman, MD;  Location: [facility] INVASIVE CV LAB;  Service: Cardiovascular;  Laterality: N/A;   CYSTOSCOPY WITH RETROGRADE PYELOGRAM, URETEROSCOPY AND STENT PLACEMENT Right 09/15/2012   Procedure: CYSTOSCOPY WITH right  RETROGRADE PYELOGRAM, right  URETEROSCOPY AND STENT PLACEMENT;  Surgeon: Valetta Fuller, MD;  Location: [facility];  Service: Urology;  Laterality: Right;   ESOPHAGOGASTRODUODENOSCOPY (EGD) WITH PROPOFOL N/A 09/10/2018   Procedure: ESOPHAGOGASTRODUODENOSCOPY (EGD) WITH PROPOFOL;  Surgeon: Shellia Cleverly, DO;  Location: [facility];   Service: Endoscopy;  Laterality: N/A;   HOLMIUM LASER APPLICATION Right 09/15/2012   Procedure: HOLMIUM LASER APPLICATION;  Surgeon: Valetta Fuller, MD;  Location: [facility];  Service: Urology;  Laterality: Right;   KNEE ARTHROSCOPY Right 1993   right   LEFT HEART CATH AND CORONARY ANGIOGRAPHY N/A 11/18/2022   Procedure: LEFT HEART CATH AND CORONARY ANGIOGRAPHY;  Surgeon: Tonny Bollman, MD;  Location: [facility] INVASIVE CV LAB;  Service: Cardiovascular;  Laterality: N/A;    Allergies  No Known Allergies  History of Present Illness     Caleb Mason has a PMH of essential hypertension, hiatal hernia, GERD, metabolic syndrome, HLD, and mild anemia.  He has a family history of premature coronary artery disease.  His father was diagnosed with CAD at age 16 and passed away recently.  After his father's passing he has been more aware/focused on his health.  He was initially referred to Dr. Herbie Baltimore for evaluation and help with management of his blood pressure.  He presented to the clinic on 05/12/2022.  During that time he reported blood pressures with systolic in the 170 range.  He had been eating poorly and not sleeping well.  His CBC showed an elevated glucose of 173, renal function  stable.  His lipid panel showed overall cholesterol of 191 and a LDL of 102 with triglycerides of 256.  It was felt that his lifestyle could be adjusted and no medications were initiated at that time.  He  presented to the clinic 06/24/23 for follow-up evaluation and stated he felt well.  He returned from a hunting trip where he was climbing up and down hills and valleys.  He did well and denied exertional chest discomfort.  He had lost around 30 pounds and his blood pressure was is around 142/80-70.  He had been getting similar readings at home.  He planned to lose another 45 pounds.  He had made strict adjustments with his diet and remained very physically active farming.  We reviewed his lipid panel and he  expressed understanding.  I asked him to continue to maintain his physical activity and maintain a strict diet.  I gave him a blood pressure log, planned repeat fasting lipids in 12 weeks and planned follow-up after lab work.  He was admitted on 11/17/2021 and discharged on 11/20/2022.  He reported chest discomfort that was accompanied by diaphoresis.  He had presented to his PCP who noted a in inferior STEMI and also inferior Q waves.  EMS transported to [place].  It was felt that he had late presenting MI.  He underwent cardiac catheterization 11/18/2022 and was noted to have distal RCA 100% stenosed, proximal RCA 30% stenosed, proximal circumflex-mid circumflex 40%, proximal LAD-mid LAD 30%.  He received PCI with DES x 1.  Echocardiogram 11/18/2022 showed an EF of 50-55%, moderate LVH, and intermediate diastolic parameters.  No significant valvular abnormalities were noted.  He was placed on dual antiplatelet platelet therapy.  He presents to the clinic today for follow-up evaluation and states he has been coughing since being home from the hospital.  He reports that for several minutes in the morning he will cough until he is able to clear mucus.  His mucus is clear.  It appears that he has contracted a upper respiratory virus.  He also reports 2 episodes of bleeding.  1 nosebleed and 1 scrape on his forearm.  We reviewed his medications.  I explained antiplatelet therapy.  He and his wife expressed understanding.  He has been walking at home but has not been very physically active.  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  I will allow him to continue to progress in his physical activity and return to work on 12/22/2022.  No cardiac restrictions.  We reviewed his cardiac catheterization and echocardiogram.  He expressed understanding.  Will plan follow-up in 3 months.  I will draw a sedimentation rate and CRP.   Today he denies chest pain,  shortness of breath, lower extremity edema, fatigue, palpitations, melena, hematuria, hemoptysis, diaphoresis, weakness, presyncope, syncope, orthopnea, and PND.   Home Medications    Prior to Admission medications   Medication Sig Start Date End Date Taking? Authorizing Provider  cholecalciferol (VITAMIN D3) 25 MCG (1000 UNIT) tablet Take 1,000 Units by mouth once a week.    [provider]  pantoprazole (PROTONIX) 40 MG tablet TAKE 1 TABLET BY MOUTH ABOUT 30 MINUTES BEFORE BREAKFAST 04/22/22   Iva Boop, MD    Family History    Family History  Problem Relation Age of Onset   Heart disease Mother        Does not know the details.   Esophageal cancer Father 3       died as a result - Dx'd post-CABG   Coronary artery disease Father 43       cabg   Heart attack Paternal Grandfather 39  died   Colon cancer Neg Hx    Pancreatic cancer Neg Hx    Stomach cancer Neg Hx    Liver disease Neg Hx    Colon polyps Neg Hx    He indicated that his mother is alive. He indicated that his father is deceased. He indicated that his paternal grandfather is deceased. He indicated that the status of his neg hx is unknown.  Social History    Social History   Socioeconomic History   Marital status: Married    Spouse name: Not on file   Number of children: 2   Years of education: Not on file   Highest education level: Associate degree: occupational, Scientist, product/process development, or vocational program  Occupational History   Occupation: Careers information officer  Tobacco Use   Smoking status: Former    Packs/day: 1.00    Years: 30.00    Additional pack years: 0.00    Total pack years: 30.00    Types: Cigarettes    Quit date: 09/15/2007    Years since quitting: 15.2   Smokeless tobacco: Never  Vaping Use   Vaping Use: Never used  Substance and Sexual Activity   Alcohol use: No    Alcohol/week: 0.0 standard drinks of alcohol   Drug use: No   Sexual activity: Yes    Partners: Female  Other  Topics Concern   Not on file  Social History Narrative   Married with 2 sons; lives with his wife.  Identifies himself as a male.   Armed forces operational officer and farmer - [place]   4 caffeinated beverages qd   04/03/2015      Works as an Insurance account manager.  Oftentimes works extra shifts.  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      As of May 12, 2022: Exercises roughly 7 days a week for least an hour.   Has given up fast food, eating much better.  Giving up sweets.  Getting better sleep.  Trying to work better hours. => Has lost 15 pounds.  Also noted improvement in blood pressure from the 170s to 150/90 today.         Social Determinants of Health   Financial Resource Strain: Not on file  Food Insecurity: Not on file  Transportation Needs: Not on file  Physical Activity: Not on file  Stress: Not on file  Social Connections: Not on file  Intimate Partner Violence: Not on file     Review of Systems    General:  No chills, fever, night sweats or weight changes.  Cardiovascular:  No chest pain, dyspnea on exertion, edema, orthopnea, palpitations, paroxysmal nocturnal dyspnea. Dermatological: No rash, lesions/masses Respiratory: No cough, dyspnea Urologic: No hematuria, dysuria Abdominal:   No nausea, vomiting, diarrhea, bright red blood per rectum, melena, or hematemesis Neurologic:  No visual changes, wkns, changes in mental status. All other systems reviewed and are otherwise negative except as noted above.  Physical Exam    VS:  BP 132/82   Pulse 83   Ht 6\' 1"  (1.854 m)   Wt 250 lb 3.2 oz (113.5 kg)   SpO2 96%   BMI 33.01 kg/m  , BMI Body mass index is 33.01 kg/m. GEN: Well nourished, well developed, in no acute distress. HEENT: normal. Neck: Supple, no JVD, carotid bruits, or masses. Cardiac: RRR, no murmurs, rubs, or gallops. No clubbing, cyanosis, edema.  Radials/DP/PT 2+ and equal bilaterally.  Respiratory:  Respirations regular  and unlabored, clear to  auscultation bilaterally. GI: Soft, nontender, nondistended, BS + x 4. MS: no deformity or atrophy. Skin: warm and dry, no rash.  Right radial cath site healed well no signs of infection. Neuro:  Strength and sensation are intact. Psych: Normal affect.  Accessory Clinical Findings    Recent Labs: 10/27/2022: ALT 33 11/20/2022: BUN 11; Creatinine, Ser 0.79; Hemoglobin 13.6; Platelets 280; Potassium 3.5; Sodium 136   Recent Lipid Panel    Component Value Date/Time   CHOL 123 11/19/2022 0316   TRIG 89 11/19/2022 0316   HDL 35 (L) 11/19/2022 0316   CHOLHDL 3.5 11/19/2022 0316   VLDL 18 11/19/2022 0316   LDLCALC 70 11/19/2022 0316         ECG personally reviewed by me today- none today.  IMPRESSIONS     1. Left ventricular ejection fraction, by estimation, is 50 to 55%. The  left ventricle has low normal function. The left ventricle demonstrates  regional wall motion abnormalities (see scoring diagram/findings for  description). There is moderate  concentric left ventricular hypertrophy. Left ventricular diastolic  parameters are indeterminate.   2. Right ventricular systolic function is normal. The right ventricular  size is normal.   3. The mitral valve is normal in structure. Mild mitral valve  regurgitation. No evidence of mitral stenosis.   4. The aortic valve is normal in structure. Aortic valve regurgitation is  not visualized. No aortic stenosis is present.   5. The inferior vena cava is normal in size with greater than 50%  respiratory variability, suggesting right atrial pressure of 3 mmHg.   FINDINGS   Left Ventricle: Left ventricular ejection fraction, by estimation, is 50  to 55%. The left ventricle has low normal function. The left ventricle  demonstrates regional wall motion abnormalities. Definity contrast agent  was given IV to delineate the left  ventricular endocardial borders. The left ventricular internal cavity size  was  normal in size. There is moderate concentric left ventricular  hypertrophy. Left ventricular diastolic parameters are indeterminate.     LV Wall Scoring:  The basal inferolateral segment and basal inferior segment are akinetic.  The  entire anterior wall, antero-lateral wall, mid and distal lateral wall,  entire septum, entire apex, and mid and distal inferior wall are  hypokinetic.   Right Ventricle: The right ventricular size is normal. No increase in  right ventricular wall thickness. Right ventricular systolic function is  normal.   Left Atrium: Left atrial size was normal in size.   Right Atrium: Right atrial size was normal in size.   Pericardium: There is no evidence of pericardial effusion. Presence of  epicardial fat layer.   Mitral Valve: The mitral valve is normal in structure. Mild mitral valve  regurgitation. No evidence of mitral valve stenosis.   Tricuspid Valve: The tricuspid valve is normal in structure. Tricuspid  valve regurgitation is not demonstrated. No evidence of tricuspid  stenosis.   Aortic Valve: The aortic valve is normal in structure. Aortic valve  regurgitation is not visualized. No aortic stenosis is present.   Pulmonic Valve: The pulmonic valve was normal in structure. Pulmonic valve  regurgitation is not visualized. No evidence of pulmonic stenosis.   Aorta: The aortic root is normal in size and structure.   Venous: The inferior vena cava is normal in size with greater than 50%  respiratory variability, suggesting right atrial pressure of 3 mmHg.   IAS/Shunts: No atrial level shunt detected by color flow Doppler.    Cardiac catheterization  Dist RCA lesion is 100% stenosed.   Prox RCA lesion is 30% stenosed.   Prox Cx to Mid Cx lesion is 40% stenosed.   Prox LAD to Mid LAD lesion is 30% stenosed.   A drug-eluting stent was successfully placed using a SYNERGY XD 3.0X16.   Post intervention, there is a 0% residual stenosis.   The  left ventricular systolic function is normal.   LV end diastolic pressure is mildly elevated.   The left ventricular ejection fraction is 50-55% by visual estimate.   1.  Patent left main with no obstructive disease 2.  Patent LAD to the apex with mild proximal and mid vessel plaquing and stenosis up to 30 to 40% 3.  Patent left circumflex with nonobstructive plaquing of 30 to 40% extending from the proximal circumflex into the first OM 4.  Total occlusion of the distal RCA, treated successfully with PCI using a 3.0 x 16 mm Synergy DES, postdilated with a 3.5 mm noncompliant balloon.  100% occlusion and TIMI 0 flow at baseline, 0% residual stenosis and TIMI-3 flow post PCI 5.  Poorly visualized endocardial borders, but apparent normal LV EF estimated at 50 to 55% with hypokinesis of the inferior wall   Recommendations: 2D echo for better assessment of post-MI LV function, ASA and ticagrelor x 12 months without interruption, post-MI medical therapy. Consider fast track DC tomorrow if remains medically stable.  Diagnostic Dominance: Right  Intervention     Assessment & Plan   1.  STEMI-reports pleuritic chest pain related to coughing..  Underwent cardiac catheterization on 11/18/2022.  Was noted to have 100% occlusion of distal RCA.  Received DES x 1.  Details above. Continue aspirin, Brilinta, rosuvastatin Increase physical activity as tolerated-May return to work (full duty) without cardiac restrictions.  Pericarditis-compliant with colchicine. Order CRP ESR   Essential hypertension-BP today 132/82. Maintain blood pressure log Continue weight loss Heart healthy low-sodium diet-salty 6 given Increase physical activity as tolerated  Hyperlipidemia 11/19/2022: Cholesterol 123; HDL 35; LDL Cholesterol 70; Triglycerides 89; VLDL 18.  Would recommend LDL goal less than 55. Taking fish oil, rosuvastatin Heart healthy low-sodium high-fiber diet Increase physical activity as tolerated Plan  for repeat fasting lipids in 8 weeks.  Metabolic syndrome-glucose elevated at 173. Heart healthy low-sodium carb modified diet Continue weight loss  Family history of premature coronary disease-father recently passed away at age 33.  He was diagnosed with CAD.  Disposition: Follow-up with Dr. Herbie Baltimore or me in 3 months.   Thomasene Ripple. Ermal Brzozowski NP-C     12/02/2022, 2:50 PM [facility] [address] Office [phone] Fax [phone]    I spent 14 minutes examining this patient, reviewing medications, and using patient centered shared decision making involving her cardiac care.  Prior to her visit I spent greater than 20 minutes reviewing her past medical history,  medications, and prior cardiac tests.

## 2022-11-28 NOTE — Telephone Encounter (Signed)
Patient's wife is calling after procedure to see if a nose bleed is something to be concerned about. She states that the nose bleed has stopped, but she would like to make sure before the weekend that there was nothing to watch out for. Please advise.

## 2022-11-30 NOTE — Telephone Encounter (Signed)
He can hold his aspirin for 5 days if another episode like this occurs.

## 2022-12-02 ENCOUNTER — Ambulatory Visit: Payer: BC Managed Care – PPO | Attending: General Practice | Admitting: General Practice

## 2022-12-02 ENCOUNTER — Encounter: Payer: Self-pay | Admitting: General Practice

## 2022-12-02 VITALS — BP 132/82 | HR 83 | Ht 73.0 in | Wt 250.2 lb

## 2022-12-02 DIAGNOSIS — Z8249 Family history of ischemic heart disease and other diseases of the circulatory system: Secondary | ICD-10-CM

## 2022-12-02 DIAGNOSIS — E785 Hyperlipidemia, unspecified: Secondary | ICD-10-CM | POA: Diagnosis not present

## 2022-12-02 DIAGNOSIS — I2119 ST elevation (STEMI) myocardial infarction involving other coronary artery of inferior wall: Secondary | ICD-10-CM | POA: Diagnosis not present

## 2022-12-02 DIAGNOSIS — I10 Essential (primary) hypertension: Secondary | ICD-10-CM

## 2022-12-02 DIAGNOSIS — E8881 Metabolic syndrome: Secondary | ICD-10-CM

## 2022-12-02 NOTE — Patient Instructions (Signed)
Medication Instructions:  The current medical regimen is effective;  continue present plan and medications as directed. Please refer to the Current Medication list given to you today.  *If you need a refill on your cardiac medications before your next appointment, please call your pharmacy*  Lab Work: ESR AND CRP TODAY If you have labs (blood work) drawn today and your tests are completely normal, you will receive your results only by:  MyChart Message (if you have MyChart) OR  A paper copy in the mail If you have any lab test that is abnormal or we need to change your treatment, we will call you to review the results.  Testing/Procedures: NONE  Follow-Up: At [facility], you and your health needs are our priority.  As part of our continuing mission to provide you with exceptional heart care, we have created designated Provider Care Teams.  These Care Teams include your primary Cardiologist (physician) and Advanced Practice Providers (APPs -  Physician Assistants and Nurse Practitioners) who all work together to provide you with the care you need, when you need it.  Your next appointment:   3-4 month(s)  Provider:   Bryan Lemma, MD     Other Instructions INCREASE PHYSICAL ACTIVITY AS TOLERATED

## 2022-12-02 NOTE — Telephone Encounter (Signed)
5/28 Patient was given completed forms by [facility] Nurse Marcelino Duster B. At patient appt.

## 2022-12-03 LAB — C-REACTIVE PROTEIN: CRP: <1 mg/L (ref 0–10)

## 2022-12-03 LAB — SEDIMENTATION RATE: Sed Rate: 23 mm/hr (ref 0–30)

## 2022-12-03 NOTE — Telephone Encounter (Signed)
Spoke topatient . He is aware if episode happens again to hold Aspirin for 5 days then restart. Patient verbalized understanding.

## 2022-12-04 ENCOUNTER — Telehealth: Payer: Self-pay | Admitting: Cardiology

## 2022-12-04 NOTE — Telephone Encounter (Signed)
Caller stated they received a letter from [employer] stating patient can return to work but they need to confirm if the patient can return to work with no restrictions.

## 2022-12-04 NOTE — Telephone Encounter (Signed)
Returned call to [employer] she states that she needs a specific letter stating when pt can go back to work, Fax to [phone]. Tried to call pt but phone just keeps ringing. Letter written and faxed. Per Edd Fabian, FNP-C progress note dated 12-02-22: May return to work (full duty) without cardiac restrictions. I will allow him to continue to progress in his physical activity and return to work on 12/22/2022.  No cardiac restrictions.

## 2022-12-05 ENCOUNTER — Emergency Department (HOSPITAL_COMMUNITY): Payer: BC Managed Care – PPO

## 2022-12-05 ENCOUNTER — Encounter (HOSPITAL_COMMUNITY): Payer: Self-pay

## 2022-12-05 ENCOUNTER — Emergency Department (HOSPITAL_COMMUNITY)
Admission: EM | Admit: 2022-12-05 | Discharge: 2022-12-05 | Disposition: A | Discharge status: Home / Self Care | Payer: BC Managed Care – PPO | Attending: Emergency Medicine | Admitting: Emergency Medicine

## 2022-12-05 DIAGNOSIS — N200 Calculus of kidney: Secondary | ICD-10-CM | POA: Diagnosis not present

## 2022-12-05 DIAGNOSIS — Z7982 Long term (current) use of aspirin: Secondary | ICD-10-CM | POA: Diagnosis not present

## 2022-12-05 DIAGNOSIS — R109 Unspecified abdominal pain: Secondary | ICD-10-CM | POA: Diagnosis present

## 2022-12-05 LAB — CBC
HCT: 44.8 % (ref 39.0–52.0)
Hemoglobin: 14.8 g/dL (ref 13.0–17.0)
MCH: 28.7 pg (ref 26.0–34.0)
MCHC: 33 g/dL (ref 30.0–36.0)
MCV: 86.8 fL (ref 80.0–100.0)
Platelets: 362 10*3/uL (ref 150–400)
RBC: 5.16 MIL/uL (ref 4.22–5.81)
RDW: 12.9 % (ref 11.5–15.5)
WBC: 16.2 10*3/uL — ABNORMAL HIGH (ref 4.0–10.5)
nRBC: 0 % (ref 0.0–0.2)

## 2022-12-05 LAB — URINALYSIS, ROUTINE W REFLEX MICROSCOPIC
Bilirubin Urine: NEGATIVE
Glucose, UA: NEGATIVE mg/dL
Ketones, ur: 20 mg/dL — AB
Leukocytes,Ua: NEGATIVE
Nitrite: NEGATIVE
Protein, ur: 100 mg/dL — AB
RBC / HPF: >50 RBC/hpf (ref 0–5)
Specific Gravity, Urine: 1.03 (ref 1.005–1.030)
pH: 5 (ref 5.0–8.0)

## 2022-12-05 LAB — BASIC METABOLIC PANEL
Anion gap: 13 (ref 5–15)
BUN: 17 mg/dL (ref 6–20)
CO2: 23 mmol/L (ref 22–32)
Calcium: 9.4 mg/dL (ref 8.9–10.3)
Chloride: 100 mmol/L (ref 98–111)
Creatinine, Ser: 1.28 mg/dL — ABNORMAL HIGH (ref 0.61–1.24)
GFR, Estimated: >60 mL/min (ref >60)
Glucose, Bld: 139 mg/dL — ABNORMAL HIGH (ref 70–99)
Potassium: 4.3 mmol/L (ref 3.5–5.1)
Sodium: 136 mmol/L (ref 135–145)

## 2022-12-05 MED ORDER — TAMSULOSIN HCL 0.4 MG PO CAPS: 0.4000 mg | ORAL_CAPSULE | Freq: Every day | ORAL | 0 refills | Status: DC

## 2022-12-05 MED ORDER — MORPHINE SULFATE (PF) 4 MG/ML IV SOLN
4.0000 mg | Freq: Once | INTRAVENOUS | Status: AC
  Administered 2022-12-05: 4 mg via INTRAVENOUS
  Filled 2022-12-05: qty 1

## 2022-12-05 MED ORDER — ONDANSETRON HCL 4 MG/2ML IJ SOLN
4.0000 mg | Freq: Once | INTRAMUSCULAR | Status: AC
  Administered 2022-12-05: 4 mg via INTRAVENOUS
  Filled 2022-12-05: qty 2

## 2022-12-05 MED ORDER — ONDANSETRON 4 MG PO TBDP: ORAL_TABLET | ORAL | 0 refills | Status: AC

## 2022-12-05 MED ORDER — MORPHINE SULFATE 15 MG PO TABS: 7.5000 mg | ORAL_TABLET | ORAL | 0 refills | Status: AC | PRN

## 2022-12-05 MED ORDER — SODIUM CHLORIDE 0.9 % IV BOLUS
1000.0000 mL | Freq: Once | INTRAVENOUS | Status: AC
  Administered 2022-12-05: 1000 mL via INTRAVENOUS

## 2022-12-05 MED ORDER — KETOROLAC TROMETHAMINE 15 MG/ML IJ SOLN
15.0000 mg | Freq: Once | INTRAMUSCULAR | Status: AC
  Administered 2022-12-05: 15 mg via INTRAVENOUS
  Filled 2022-12-05: qty 1

## 2022-12-05 NOTE — ED Provider Notes (Signed)
[place] EMERGENCY DEPARTMENT AT [facility] Provider Note   CSN: [id number] Arrival date & time: 12/05/22  1842     History  Chief Complaint  Patient presents with   Flank Pain    Caleb Mason is a 62 y.o. male.  62 yo M with a chief complaint of right flank pain.  Feels, like when he had a kidney stone in the past.  Has felt hot and cold off and on has had some nausea and vomiting.  Pain started yesterday afternoon and lasted for short time and resolved and has been very persistent today.  Nothing seems to make it better or worse.  Has had hematuria.  Has required intervention for stones in the past.   Flank Pain       Home Medications Prior to Admission medications   Medication Sig Start Date End Date Taking? Authorizing Provider  morphine (MSIR) 15 MG tablet Take 0.5 tablets (7.5 mg total) by mouth every 4 (four) hours as needed for severe pain. 12/05/22  Yes Melene Plan, DO  ondansetron (ZOFRAN-ODT) 4 MG disintegrating tablet 4mg  ODT q4 hours prn nausea/vomit 12/05/22  Yes Melene Plan, DO  tamsulosin (FLOMAX) 0.4 MG CAPS capsule Take 1 capsule (0.4 mg total) by mouth daily after supper. 12/05/22  Yes Melene Plan, DO  aspirin EC 81 MG tablet Take 1 tablet (81 mg total) by mouth daily. Swallow whole. 11/21/22   Abagail Kitchens, PA-C  colchicine 0.6 MG tablet Take 1 tablet (0.6 mg total) by mouth daily. For 6 weeks total. 11/21/22   Abagail Kitchens, PA-C  nitroGLYCERIN (NITROSTAT) 0.4 MG SL tablet Place 1 tablet (0.4 mg total) under the tongue every 5 (five) minutes x 3 doses as needed for chest pain. Patient not taking: Reported on 12/02/2022 11/20/22   Abagail Kitchens, PA-C  pantoprazole (PROTONIX) 40 MG tablet TAKE 1 TABLET BY MOUTH ABOUT 30 MINUTES BEFORE BREAKFAST Patient taking differently: Take 40 mg by mouth daily. 04/22/22   Iva Boop, MD  rosuvastatin (CRESTOR) 20 MG tablet Take 1 tablet (20 mg total) by mouth daily. 11/06/22 02/04/23  Carlos Levering, NP   ticagrelor (BRILINTA) 90 MG TABS tablet Take 1 tablet (90 mg total) by mouth 2 (two) times daily. 11/20/22   Abagail Kitchens, PA-C      Allergies    Patient has no known allergies.    Review of Systems   Review of Systems  Genitourinary:  Positive for flank pain.    Physical Exam Updated Vital Signs BP 135/76 (BP Location: Left Arm)   Pulse 86   Temp 98.5 F (36.9 C)   Resp 16   Ht 6\' 1"  (1.854 m)   Wt 113.4 kg   SpO2 96%   BMI 32.98 kg/m  Physical Exam Vitals and nursing note reviewed.  Constitutional:      Appearance: He is well-developed.  HENT:     Head: Normocephalic and atraumatic.  Eyes:     Pupils: Pupils are equal, round, and reactive to light.  Neck:     Vascular: No JVD.  Cardiovascular:     Rate and Rhythm: Normal rate and regular rhythm.     Heart sounds: No murmur heard.    No friction rub. No gallop.  Pulmonary:     Effort: No respiratory distress.     Breath sounds: No wheezing.  Abdominal:     General: There is no distension.     Tenderness: There is no abdominal tenderness.  There is no guarding or rebound.  Musculoskeletal:        General: Normal range of motion.     Cervical back: Normal range of motion and neck supple.  Skin:    Coloration: Skin is not pale.     Findings: No rash.  Neurological:     Mental Status: He is alert and oriented to person, place, and time.  Psychiatric:        Behavior: Behavior normal.     ED Results / Procedures / Treatments   Labs (all labs ordered are listed, but only abnormal results are displayed) Labs Reviewed  URINALYSIS, ROUTINE W REFLEX MICROSCOPIC - Abnormal; Notable for the following components:      Result Value   Color, Urine AMBER (*)    APPearance CLOUDY (*)    Hgb urine dipstick LARGE (*)    Ketones, ur 20 (*)    Protein, ur 100 (*)    Bacteria, UA RARE (*)    All other components within normal limits  BASIC METABOLIC PANEL - Abnormal; Notable for the following components:   Glucose,  Bld 139 (*)    Creatinine, Ser 1.28 (*)    All other components within normal limits  CBC - Abnormal; Notable for the following components:   WBC 16.2 (*)    All other components within normal limits    EKG None  Radiology CT Renal Stone Study  Result Date: 12/05/2022 CLINICAL DATA:  Abdominal and flank pain with stone suspected. EXAM: CT ABDOMEN AND PELVIS WITHOUT CONTRAST TECHNIQUE: Multidetector CT imaging of the abdomen and pelvis was performed following the standard protocol without IV contrast. RADIATION DOSE REDUCTION: This exam was performed according to the departmental dose-optimization program which includes automated exposure control, adjustment of the mA and/or kV according to patient size and/or use of iterative reconstruction technique. COMPARISON:  09/09/2018 FINDINGS: Lower chest: Focal area of infiltration or atelectasis in the left lung base medially. Hepatobiliary: No focal liver abnormality is seen. Status post cholecystectomy. No biliary dilatation. Pancreas: Unremarkable. No pancreatic ductal dilatation or surrounding inflammatory changes. Spleen: Normal in size without focal abnormality. Adrenals/Urinary Tract: No adrenal gland nodules. There is a 3 mm stone in the right ureteropelvic junction with proximal pyelocaliectasis and stranding around the right kidney. Distal ureter is decompressed. Cyst in the left kidney measuring 4.3 cm diameter. No change since prior study. No imaging follow-up is indicated. Left kidney and ureter as well as the bladder are otherwise unremarkable. Stomach/Bowel: Stomach is within normal limits. Appendix appears normal. No evidence of bowel wall thickening, distention, or inflammatory changes. Small bowel diverticulum. Vascular/Lymphatic: Aortic atherosclerosis. No enlarged abdominal or pelvic lymph nodes. Reproductive: Prostate gland is enlarged. Other: No free air or free fluid in the abdomen. Abdominal wall musculature appears intact.  Musculoskeletal: Degenerative changes in the spine. IMPRESSION: 1. 3 mm stone in the right ureteropelvic junction with moderate proximal obstruction. 2. Focal area of infiltration or atelectasis in the left lung base medially could indicate focal pneumonia. 3. Aortic atherosclerosis. Electronically Signed   By: Burman Nieves M.D.   On: 12/05/2022 21:38    Procedures Procedures    Medications Ordered in ED Medications  morphine (PF) 4 MG/ML injection 4 mg (4 mg Intravenous Given 12/05/22 2026)  ondansetron (ZOFRAN) injection 4 mg (4 mg Intravenous Given 12/05/22 2026)  sodium chloride 0.9 % bolus 1,000 mL (0 mLs Intravenous Stopped 12/05/22 2214)  ketorolac (TORADOL) 15 MG/ML injection 15 mg (15 mg Intravenous Given 12/05/22 2026)  ED Course/ Medical Decision Making/ A&P                             Medical Decision Making Amount and/or Complexity of Data Reviewed Labs: ordered. Radiology: ordered.  Risk Prescription drug management.   62 yo M with a chief complaints of right flank pain.  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  Presenting most like a kidney stone currently.  Will obtain CT imaging.  Treat pain and nausea.  Reassess.  UA negative for infection on my independent interpretation.  CT imaging with a right 3 mm UPJ stone.  He has a very mild bump in his renal function.  Mild leukocytosis.  Giving a bolus of IV fluids.  Patient reassessed and feeling much better.  Will discharge home.  Urology cardiology and PCP follow-up.  The radiologist read that there is possible pneumonia in the left lung base.  Patient without cough no fevers.  Will hold off on treatment at this time.   10:31 PM:  I have discussed the diagnosis/risks/treatment options with the patient and family.  Evaluation and diagnostic testing in the emergency department does not suggest an emergent condition requiring admission or immediate intervention beyond what has been performed at this  time.  They will follow up with Urology, PCP, cards. We also discussed returning to the ED immediately if new or worsening sx occur. We discussed the sx which are most concerning (e.g., sudden worsening pain, fever, inability to tolerate by mouth) that necessitate immediate return. Medications administered to the patient during their visit and any new prescriptions provided to the patient are listed below.  Medications given during this visit Medications  morphine (PF) 4 MG/ML injection 4 mg (4 mg Intravenous Given 12/05/22 2026)  ondansetron (ZOFRAN) injection 4 mg (4 mg Intravenous Given 12/05/22 2026)  sodium chloride 0.9 % bolus 1,000 mL (0 mLs Intravenous Stopped 12/05/22 2214)  ketorolac (TORADOL) 15 MG/ML injection 15 mg (15 mg Intravenous Given 12/05/22 2026)     The patient appears reasonably screen and/or stabilized for discharge and I doubt any other medical condition or other [facility] requiring further screening, evaluation, or treatment in the ED at this time prior to discharge.         Final Clinical Impression(s) / ED Diagnoses Final diagnoses:  Nephrolithiasis    Rx / DC Orders ED Discharge Orders          Ordered    morphine (MSIR) 15 MG tablet  Every 4 hours PRN        12/05/22 2225    ondansetron (ZOFRAN-ODT) 4 MG disintegrating tablet        12/05/22 2225    tamsulosin (FLOMAX) 0.4 MG CAPS capsule  Daily after supper        12/05/22 2225              Melene Plan, DO 12/05/22 2231

## 2022-12-05 NOTE — Discharge Instructions (Addendum)
Please your cardiologist your urologist and your family doctor know about your visit today.  Please return for worsening pain fever inability to eat or drink.  Take tylenol 1000mg (2 extra strength) four times a day.   Then take the pain medicine if you feel like you need it. Narcotics do not help with the pain, they only make you care about it less.  You can become addicted to this, people may break into your house to steal it.  It will constipate you.  If you drive under the influence of this medicine you can get a DUI.

## 2022-12-05 NOTE — ED Triage Notes (Signed)
Pt c/o right flank pain since yesterday that comes and goes. Pt states it feels similar to past stones. Pt has had emesis every time he goes to drink water to try and pass. Pt endorses some blood in urine yesterday  No relief with hydrocodone  Of note, Pt had recent cardiac cath for inferior STEMI.

## 2022-12-08 ENCOUNTER — Other Ambulatory Visit: Payer: Self-pay | Admitting: Internal Medicine

## 2022-12-09 ENCOUNTER — Telehealth: Payer: Self-pay | Admitting: Cardiology

## 2022-12-09 NOTE — Telephone Encounter (Signed)
Informed Cardiac rehab - faxed outpatient order form   Signed.

## 2022-12-09 NOTE — Telephone Encounter (Signed)
Calling in regards to Outpatient order form that needs to be signed and faxed back. She states that order has been faxed twice already on 5/21 and 5/31 but has yet to get a response and pt is scheduled to come in this Thursday. Office states they will refax again today. Please advise

## 2022-12-10 ENCOUNTER — Telehealth: Payer: Self-pay | Admitting: Cardiology

## 2022-12-10 NOTE — Telephone Encounter (Signed)
Patient wife wanted to inform you that patient does have a kidney stone he has not passed and he has pneumonia. He went to ED 5/31. PCP is aware as well

## 2022-12-10 NOTE — Telephone Encounter (Signed)
Wife called to report as requested by the ED that patient went to ED on 5/31 for a kidney stone and he has not passed it as yet.  Wife stated patient was given pain medication which he has not taken but he was also given an antibiotic (azithromycin, 250mg ) and has 1 tablet left to take.

## 2022-12-12 NOTE — Telephone Encounter (Signed)
Thnx for the update  [facility]

## 2022-12-16 ENCOUNTER — Other Ambulatory Visit (HOSPITAL_COMMUNITY): Payer: Self-pay

## 2022-12-18 ENCOUNTER — Other Ambulatory Visit: Payer: Self-pay | Admitting: Urology

## 2022-12-29 NOTE — Progress Notes (Signed)
COVID Vaccine received:  []  No [x]  Yes Date of any COVID positive Test in last 90 days: No PCP - Georgana Curio FNP Cardiologist - Dr. Bryan Lemma  Chest x-ray - 11/18/22 EPIC EKG -  12/02/22  EPIC Stress Test -  ECHO - 11/18/22  EPIC Cardiac Cath -11/18/22  EPIC   Bowel Prep - [x]  No  []   Yes ______  Pacemaker / ICD device [x]  No []  Yes   Spinal Cord Stimulator:[x]  No []  Yes       History of Sleep Apnea? [x]  No []  Yes   CPAP used?- [x]  No []  Yes    Does the patient monitor blood sugar?          [x]  No []  Yes  []  N/A  Patient has: [x]  NO Hx DM   []  Pre-DM                 []  DM1  []   DM2 Does patient have a [employer] or Dexacom? [x]  No []  Yes   Fasting Blood Sugar Ranges-  Checks Blood Sugar _____ times a day  GLP1 agonist / usual dose - No GLP1 instructions:  SGLT-2 inhibitors / usual dose - No SGLT-2 instructions:   Blood Thinner / Instructions: Brilinta  Aspirin Instructions:ASA 81mg   Comments: Continue both Brilinta and ASA per Dr. Berneice Heinrich  Activity level: Patient is able to climb a flight of stairs without difficulty; [x]  No CP  [x]  No SOB, _   Patient can  perform ADLs without assistance.   Anesthesia review: HTN, Hospitalization d/t MI [id number], CAD,   Patient denies shortness of breath, fever, cough and chest pain at PAT appointment.  Patient verbalized understanding and agreement to the Pre-Surgical Instructions that were given to them at this PAT appointment. Patient was also educated of the need to review these PAT instructions again prior to his/her surgery.I reviewed the appropriate phone numbers to call if they have any and questions or concerns.

## 2022-12-29 NOTE — Patient Instructions (Signed)
SURGICAL WAITING ROOM VISITATION  Patients having surgery or a procedure may have no more than 2 support people in the waiting area - these visitors may rotate.    Children under the age of 69 must have an adult with them who is not the patient.  Due to an increase in RSV and influenza rates and associated hospitalizations, children ages 96 and under may not visit patients in [facility] hospitals.  If the patient needs to stay at the hospital during part of their recovery, the visitor guidelines for inpatient rooms apply. Pre-op nurse will coordinate an appropriate time for 1 support person to accompany patient in pre-op.  This support person may not rotate.    Please refer to the [facility] website for the visitor guidelines for Inpatients (after your surgery is over and you are in a regular room).       Your procedure is scheduled on: 01/07/23   [facility]    Report to admitting at 1:30 PM   Call this number if you have problems the morning of surgery [phone]   Do not eat food :After Midnight.   After Midnight you may have the following liquids until 12:45  PM DAY OF SURGERY  Water Non-Citrus Juices (without pulp, NO RED-Apple, White grape, White cranberry) Black Coffee (NO MILK/CREAM OR CREAMERS, sugar ok)  Clear Tea (NO MILK/CREAM OR CREAMERS, sugar ok) regular and decaf                             Plain Jell-O (NO RED)                                           Fruit ices (not with fruit pulp, NO RED)                                     Popsicles (NO RED)                                                               Sports drinks like Gatorade (NO RED)                The day of surgery:  Drink ONE (1) Pre-Surgery Clear Ensure at 12:45 PM the afternoon of surgery. Drink in one sitting. Do not sip.  This drink was given to you during your hospital  pre-op appointment visit. Nothing else to drink after completing the  Pre-Surgery Clear  Ensure.     Oral Hygiene is also important to reduce your risk of infection.                                    Remember - BRUSH YOUR TEETH THE MORNING OF SURGERY WITH YOUR REGULAR TOOTHPASTE   Take these medicines the morning of surgery: Rosuvastatin(Crestor), Pantoprazole(Protonix), Pain med if needed Continue Brilinta and ASA day of surgery per patient  Per Dr. Berneice Heinrich  You may not have any metal on your body including hair pins, jewelry, and body piercing             Do not wear make-up, lotions, powders, cologne, or deodorant .               Men may shave face and neck.   Do not bring valuables to the hospital. [facility] IS NOT             RESPONSIBLE   FOR VALUABLES.   Contacts, glasses, dentures or bridgework may not be worn into surgery.  DO NOT BRING YOUR HOME MEDICATIONS TO THE HOSPITAL. PHARMACY WILL DISPENSE MEDICATIONS LISTED ON YOUR MEDICATION LIST TO YOU DURING YOUR ADMISSION IN THE HOSPITAL!    Patients discharged on the day of surgery will not be allowed to drive home.  Someone NEEDS to stay with you for the first 24 hours after anesthesia.   Special Instructions: Bring a copy of your healthcare power of attorney and living will documents the day of surgery if you haven't scanned them before.              Please read over the following fact sheets you were given: IF YOU HAVE QUESTIONS ABOUT YOUR PRE-OP INSTRUCTIONS PLEASE CALL [phone]    If you received a COVID test during your pre-op visit  it is requested that you wear a mask when out in public, stay away from anyone that may not be feeling well and notify your surgeon if you develop symptoms. If you test positive for Covid or have been in contact with anyone that has tested positive in the last 10 days please notify you surgeon.    [facility] - Preparing for Surgery Before surgery, you can play an important role.  Because skin is not sterile, your skin needs to be as free of germs as possible.  You  can reduce the number of germs on your skin by washing with CHG (chlorahexidine gluconate) soap before surgery.  CHG is an antiseptic cleaner which kills germs and bonds with the skin to continue killing germs even after washing. Please DO NOT use if you have an allergy to CHG or antibacterial soaps.  If your skin becomes reddened/irritated stop using the CHG and inform your nurse when you arrive at Short Stay. Do not shave (including legs and underarms) for at least 48 hours prior to the first CHG shower.  You may shave your face/neck.  Please follow these instructions carefully:  1.  Shower with CHG Soap the night before surgery and the  morning of surgery.  2.  If you choose to wash your hair, wash your hair first as usual with your normal  shampoo.  3.  After you shampoo, rinse your hair and body thoroughly to remove the shampoo.                             4.  Use CHG as you would any other liquid soap.  You can apply chg directly to the skin and wash.  Gently with a scrungie or clean washcloth.  5.  Apply the CHG Soap to your body ONLY FROM THE NECK DOWN.   Do   not use on face/ open                           Wound or open sores. Avoid contact with  eyes, ears mouth and   genitals (private parts).                       Wash face,  Genitals (private parts) with your normal soap.             6.  Wash thoroughly, paying special attention to the area where your    surgery  will be performed.  7.  Thoroughly rinse your body with warm water from the neck down.  8.  DO NOT shower/wash with your normal soap after using and rinsing off the CHG Soap.                9.  Pat yourself dry with a clean towel.            10.  Wear clean pajamas.            11.  Place clean sheets on your bed the night of your first shower and do not  sleep with pets. Day of Surgery : Do not apply any lotions/deodorants the morning of surgery.  Please wear clean clothes to the hospital/surgery center.  FAILURE TO FOLLOW  THESE INSTRUCTIONS MAY RESULT IN THE CANCELLATION OF YOUR SURGERY  PATIENT SIGNATURE_________________________________  NURSE SIGNATURE__________________________________  ________________________________________________________________________

## 2022-12-30 ENCOUNTER — Other Ambulatory Visit: Payer: Self-pay

## 2022-12-30 ENCOUNTER — Encounter (HOSPITAL_COMMUNITY)
Admission: RE | Admit: 2022-12-30 | Discharge: 2022-12-30 | Disposition: A | Discharge status: Home / Self Care | Payer: BC Managed Care – PPO | Source: Ambulatory Visit | Attending: Urology | Admitting: Urology

## 2022-12-30 ENCOUNTER — Encounter (HOSPITAL_COMMUNITY): Payer: Self-pay

## 2022-12-30 VITALS — BP 134/91 | HR 92 | Temp 97.9°F | Resp 16 | Ht 73.0 in | Wt 248.0 lb

## 2022-12-30 DIAGNOSIS — I252 Old myocardial infarction: Secondary | ICD-10-CM | POA: Diagnosis not present

## 2022-12-30 DIAGNOSIS — I2510 Atherosclerotic heart disease of native coronary artery without angina pectoris: Secondary | ICD-10-CM | POA: Diagnosis not present

## 2022-12-30 DIAGNOSIS — Z01812 Encounter for preprocedural laboratory examination: Secondary | ICD-10-CM | POA: Insufficient documentation

## 2022-12-30 DIAGNOSIS — I10 Essential (primary) hypertension: Secondary | ICD-10-CM | POA: Diagnosis not present

## 2022-12-30 DIAGNOSIS — N202 Calculus of kidney with calculus of ureter: Secondary | ICD-10-CM | POA: Diagnosis not present

## 2022-12-30 DIAGNOSIS — I700 Atherosclerosis of aorta: Secondary | ICD-10-CM | POA: Insufficient documentation

## 2022-12-30 DIAGNOSIS — Z01818 Encounter for other preprocedural examination: Secondary | ICD-10-CM

## 2022-12-30 DIAGNOSIS — Z955 Presence of coronary angioplasty implant and graft: Secondary | ICD-10-CM | POA: Insufficient documentation

## 2022-12-30 HISTORY — DX: Acute myocardial infarction, unspecified: I21.9

## 2022-12-30 HISTORY — DX: Personal history of urinary calculi: Z87.442

## 2022-12-30 HISTORY — DX: Atherosclerotic heart disease of native coronary artery without angina pectoris: I25.10

## 2022-12-30 HISTORY — DX: Pneumonia, unspecified organism: J18.9

## 2022-12-30 LAB — BASIC METABOLIC PANEL
Anion gap: 6 (ref 5–15)
BUN: 18 mg/dL (ref 6–20)
CO2: 23 mmol/L (ref 22–32)
Calcium: 8.9 mg/dL (ref 8.9–10.3)
Chloride: 107 mmol/L (ref 98–111)
Creatinine, Ser: 0.81 mg/dL (ref 0.61–1.24)
GFR, Estimated: >60 mL/min (ref >60)
Glucose, Bld: 97 mg/dL (ref 70–99)
Potassium: 3.7 mmol/L (ref 3.5–5.1)
Sodium: 136 mmol/L (ref 135–145)

## 2022-12-30 LAB — CBC
HCT: 44.7 % (ref 39.0–52.0)
Hemoglobin: 14.4 g/dL (ref 13.0–17.0)
MCH: 28.6 pg (ref 26.0–34.0)
MCHC: 32.2 g/dL (ref 30.0–36.0)
MCV: 88.7 fL (ref 80.0–100.0)
Platelets: 224 10*3/uL (ref 150–400)
RBC: 5.04 MIL/uL (ref 4.22–5.81)
RDW: 13.3 % (ref 11.5–15.5)
WBC: 6 10*3/uL (ref 4.0–10.5)
nRBC: 0 % (ref 0.0–0.2)

## 2022-12-31 ENCOUNTER — Telehealth: Payer: Self-pay | Admitting: Cardiology

## 2022-12-31 NOTE — Telephone Encounter (Signed)
I will fax notes to requesting office to see all notes from pre op APP. I have also called Dr. Emmaline Life office to update the recommendations. I left a vm for the surgery scheduler. If any questions reach out to the pre op team.

## 2022-12-31 NOTE — Telephone Encounter (Signed)
Verdon Cummins,  We have received a surgical clearance request for Mr Barkan for a uteroscopy with laser lithotripsy.  The performing doctor is aware of his recent stents placed and will be doing this procedure without holding DAPT.Marland Kitchen They were seen recently in clinic on 12/02/22. Can you please comment on surgical clearance. Please forward you guidance and recommendations to P CV DIV PREOP.  Thanks,  Alden Server

## 2022-12-31 NOTE — Progress Notes (Addendum)
Anesthesia Chart Review   Case: [id number] Date/Time: 01/07/23 1530   Procedures:      CYSTOSCOPY WITH RETROGRADE PYELOGRAM, URETEROSCOPY AND STENT PLACEMENT (Right) - 1 HR     HOLMIUM LASER APPLICATION (Right)   Anesthesia type: General   Pre-op diagnosis: URETERAL STONE   Location: [facility]OM 03 / [facility]   Surgeons: Loletta Parish., MD       DISCUSSION: 62 year old former smoker with history of PONV, HTN, recent inferior STEMI on 11/18/22, CAD s/p DES to RCA on 11/18/22, pericarditis, ureteral stone scheduled for above procedure 01/07/2023 with Dr. Sebastian Ache.  Patient followed up with Cardiology on 12/02/22 after his STEMI. Noted he has been coughing since discharge from the hospital and provider felt he had a URI. Cardiac clearance requested and provided 6/28 via telephone visit:   "Chart reviewed as part of pre-operative protocol coverage. Given past medical history and time since last visit, based on ACC/AHA guidelines, Kenn Demartin is at acceptable risk for the planned procedure without further cardiovascular testing.  Patient will be continued on DAPT with no interruption in therapy."  Anticipate patient can proceed barring any acute change  VS: BP (!) 134/91   Pulse 92   Temp 36.6 C (Oral)   Resp 16   Ht 6\' 1"  (1.854 m)   Wt 112.5 kg   SpO2 99%   BMI 32.72 kg/m   PROVIDERS: Delorse Lek, FNP is PCP Bryan Lemma, MD is Cardiologist   LABS: Labs reviewed: Acceptable for surgery. (all labs ordered are listed, but only abnormal results are displayed)  Labs Reviewed  CBC  BASIC METABOLIC PANEL     IMAGES:  CT Renal 12/05/22:  IMPRESSION: 1. 3 mm stone in the right ureteropelvic junction with moderate proximal obstruction. 2. Focal area of infiltration or atelectasis in the left lung base medially could indicate focal pneumonia. 3. Aortic atherosclerosis.   EKG 12/02/22:  NSR LAD Inferior infarct, old    CV:  Cardiac catheterization  11/18/2022   Dist RCA lesion is 100% stenosed.   Prox RCA lesion is 30% stenosed.   Prox Cx to Mid Cx lesion is 40% stenosed.   Prox LAD to Mid LAD lesion is 30% stenosed.   A drug-eluting stent was successfully placed using a SYNERGY XD 3.0X16.   Post intervention, there is a 0% residual stenosis.   The left ventricular systolic function is normal.   LV end diastolic pressure is mildly elevated.   The left ventricular ejection fraction is 50-55% by visual estimate.   1.  Patent left main with no obstructive disease 2.  Patent LAD to the apex with mild proximal and mid vessel plaquing and stenosis up to 30 to 40% 3.  Patent left circumflex with nonobstructive plaquing of 30 to 40% extending from the proximal circumflex into the first OM 4.  Total occlusion of the distal RCA, treated successfully with PCI using a 3.0 x 16 mm Synergy DES, postdilated with a 3.5 mm noncompliant balloon.  100% occlusion and TIMI 0 flow at baseline, 0% residual stenosis and TIMI-3 flow post PCI 5.  Poorly visualized endocardial borders, but apparent normal LV EF estimated at 50 to 55% with hypokinesis of the inferior wall   Recommendations: 2D echo for better assessment of post-MI LV function, ASA and ticagrelor x 12 months without interruption, post-MI medical therapy. Consider fast track DC tomorrow if remains medically stable.  Echo 11/18/2022 1. Left ventricular ejection fraction, by estimation, is 50 to  55%. The  left ventricle has low normal function. The left ventricle demonstrates  regional wall motion abnormalities (see scoring diagram/findings for  description). There is moderate  concentric left ventricular hypertrophy. Left ventricular diastolic  parameters are indeterminate.   2. Right ventricular systolic function is normal. The right ventricular  size is normal.   3. The mitral valve is normal in structure. Mild mitral valve  regurgitation. No evidence of mitral stenosis.   4. The aortic valve  is normal in structure. Aortic valve regurgitation is  not visualized. No aortic stenosis is present.   5. The inferior vena cava is normal in size with greater than 50%  respiratory variability, suggesting right atrial pressure of 3 mmHg.  Past Medical History:  Diagnosis Date   Adenomatous colon polyp 2013   6mm, tubular   Allergy    seasonal   Anemia    Broken hip (HCC) left   Chronic duodenal ulcer with gastric outlet obstruction 11/02/2018   Complication of anesthesia    got sick    Coronary artery disease    Diverticulosis    Essential hypertension    At night for been given official diagnosis.   GERD (gastroesophageal reflux disease)    History of kidney stones    Impaired glucose regulation    Injury    Broken hip   Kidney stones    Lumbar back pain 2011   had hip fracture that caused lower back problems => has troubles with sciatica   Myocardial infarction (HCC)    Pancreatitis    Pneumonia    PONV (postoperative nausea and vomiting)    Vitamin D deficiency     Past Surgical History:  Procedure Laterality Date   BIOPSY  09/10/2018   Procedure: BIOPSY;  Surgeon: Shellia Cleverly, DO;  [facility];  Service: Endoscopy;;   CHOLECYSTECTOMY  2008   had pancreatitis   COLONOSCOPY W/ POLYPECTOMY     CORONARY STENT INTERVENTION N/A 11/18/2022   Procedure: CORONARY STENT INTERVENTION;  Surgeon: Tonny Bollman, MD;  Location: [facility] INVASIVE CV LAB;  Service: Cardiovascular;  Laterality: N/A;   CYSTOSCOPY WITH RETROGRADE PYELOGRAM, URETEROSCOPY AND STENT PLACEMENT Right 09/15/2012   Procedure: CYSTOSCOPY WITH right  RETROGRADE PYELOGRAM, right  URETEROSCOPY AND STENT PLACEMENT;  Surgeon: Valetta Fuller, MD;  Location: [facility];  Service: Urology;  Laterality: Right;   ESOPHAGOGASTRODUODENOSCOPY (EGD) WITH PROPOFOL N/A 09/10/2018   Procedure: ESOPHAGOGASTRODUODENOSCOPY (EGD) WITH PROPOFOL;  Surgeon: Shellia Cleverly, DO;  [facility];   Service: Endoscopy;  Laterality: N/A;   HOLMIUM LASER APPLICATION Right 09/15/2012   Procedure: HOLMIUM LASER APPLICATION;  Surgeon: Valetta Fuller, MD;  [facility]  Service: Urology;  Laterality: Right;   KNEE ARTHROSCOPY Right 1993   right   LEFT HEART CATH AND CORONARY ANGIOGRAPHY N/A 11/18/2022   Procedure: LEFT HEART CATH AND CORONARY ANGIOGRAPHY;  Surgeon: Tonny Bollman, MD;  Location: [facility] INVASIVE CV LAB;  Service: Cardiovascular;  Laterality: N/A;    MEDICATIONS:  aspirin EC 81 MG tablet   colchicine 0.6 MG tablet   ketorolac (TORADOL) 10 MG tablet   morphine (MSIR) 15 MG tablet   nitroGLYCERIN (NITROSTAT) 0.4 MG SL tablet   ondansetron (ZOFRAN-ODT) 4 MG disintegrating tablet   pantoprazole (PROTONIX) 40 MG tablet   rosuvastatin (CRESTOR) 20 MG tablet   silodosin (RAPAFLO) 8 MG CAPS capsule   tamsulosin (FLOMAX) 0.4 MG CAPS capsule   ticagrelor (BRILINTA) 90 MG TABS tablet  No current facility-administered medications for this encounter.   Marcille Blanco [facility] Short Stay/Anesthesiology [facility] Phone [phone] 01/02/2023 12:04 PM

## 2022-12-31 NOTE — Telephone Encounter (Signed)
Callback team,  Please contact requesting provider office for instructions on holding medications prior to urology procedure.  Patient had a recent PCI and is currently on ASA and Brilinta with instructions for 12 months uninterrupted.  Procedure will need to be rescheduled for at least 6 to 12 months if elective and 3 to 6 months if urgent.  Please let me know if have any further questions.

## 2022-12-31 NOTE — Telephone Encounter (Signed)
   Pre-operative Risk Assessment    Patient Name: Caleb Mason  DOB: February 23, 1961 MRN: [id number]      Request for Surgical Clearance    Procedure:   Ureteroscopy with Laser Lithotripsy  Date of Surgery:  Clearance 01/07/23                                 Surgeon:  Dr. Sebastian Ache Surgeon's Group or Practice Name:  [facility] Phone number:  [phone] Fax number:  [phone]   Type of Clearance Requested:   - Medical    Type of Anesthesia:  General    Additional requests/questions:      SignedLucia Estelle   12/31/2022, 2:53 PM

## 2022-12-31 NOTE — Telephone Encounter (Signed)
Dr. Emmaline Life surgery scheduler called back to update that Dr. Berneice Heinrich is aware of recent stents and will NOT need to hold any blood thinners. I assured their office that I will re-address with the pre op APP.

## 2023-01-01 NOTE — Telephone Encounter (Signed)
   Patient Name: Caleb Mason  DOB: 1961-07-02 MRN: [id number]  Primary Cardiologist: Bryan Lemma, MD  Chart reviewed as part of pre-operative protocol coverage. Given past medical history and time since last visit, based on ACC/AHA guidelines, Tymier Britain is at acceptable risk for the planned procedure without further cardiovascular testing.   Patient will be continued on DAPT with no interruption in therapy.  The patient was advised that if he develops new symptoms prior to surgery to contact our office to arrange for a follow-up visit, and he verbalized understanding.  I will route this recommendation to the requesting party via Epic fax function and remove from pre-op pool.  Please call with questions.  Napoleon Form, Leodis Rains, NP 01/01/2023, 7:35 AM

## 2023-01-02 NOTE — Anesthesia Preprocedure Evaluation (Signed)
Anesthesia Evaluation  Patient identified by MRN, date of birth, ID band Patient awake    Reviewed: Allergy & Precautions, NPO status , Patient's Chart, lab work & pertinent test results  History of Anesthesia Complications (+) PONV and history of anesthetic complications  Airway Mallampati: II  TM Distance: >3 FB Neck ROM: Full    Dental  (+) Dental Advisory Given   Pulmonary former smoker   breath sounds clear to auscultation       Cardiovascular hypertension, Pt. on medications + CAD, + Past MI and + Cardiac Stents (cardiac stents placed 11/18/22)   Rhythm:Regular Rate:Normal     Neuro/Psych negative neurological ROS     GI/Hepatic Neg liver ROS, hiatal hernia, PUD,GERD  ,,  Endo/Other  negative endocrine ROS    Renal/GU Renal disease     Musculoskeletal   Abdominal   Peds  Hematology negative hematology ROS (+)   Anesthesia Other Findings   Reproductive/Obstetrics                             Anesthesia Physical Anesthesia Plan  ASA: 3  Anesthesia Plan: General   Post-op Pain Management: Tylenol PO (pre-op)*   Induction: Intravenous  PONV Risk Score and Plan: 3 and Dexamethasone, Ondansetron, Midazolam and Treatment may vary due to age or medical condition  Airway Management Planned: LMA  Additional Equipment:   Intra-op Plan:   Post-operative Plan: Extubation in OR  Informed Consent: I have reviewed the patients History and Physical, chart, labs and discussed the procedure including the risks, benefits and alternatives for the proposed anesthesia with the patient or authorized representative who has indicated his/her understanding and acceptance.     Dental advisory given  Plan Discussed with: CRNA  Anesthesia Plan Comments: ( )        Anesthesia Quick Evaluation

## 2023-01-07 ENCOUNTER — Encounter (HOSPITAL_COMMUNITY)
Admission: RE | Disposition: A | Discharge status: Home / Self Care | Payer: Self-pay | Source: Home / Self Care | Attending: Urology

## 2023-01-07 ENCOUNTER — Encounter (HOSPITAL_COMMUNITY): Payer: Self-pay | Admitting: Urology

## 2023-01-07 ENCOUNTER — Ambulatory Visit (HOSPITAL_COMMUNITY): Payer: BC Managed Care – PPO

## 2023-01-07 ENCOUNTER — Other Ambulatory Visit: Payer: Self-pay

## 2023-01-07 ENCOUNTER — Ambulatory Visit (HOSPITAL_COMMUNITY): Payer: BC Managed Care – PPO | Admitting: Physician Assistant

## 2023-01-07 ENCOUNTER — Ambulatory Visit (HOSPITAL_COMMUNITY)
Admission: RE | Admit: 2023-01-07 | Discharge: 2023-01-07 | Disposition: A | Discharge status: Home / Self Care | Payer: BC Managed Care – PPO | Attending: Urology | Admitting: Urology

## 2023-01-07 ENCOUNTER — Ambulatory Visit (HOSPITAL_COMMUNITY): Payer: BC Managed Care – PPO | Admitting: Certified Registered"

## 2023-01-07 DIAGNOSIS — I252 Old myocardial infarction: Secondary | ICD-10-CM | POA: Insufficient documentation

## 2023-01-07 DIAGNOSIS — Z955 Presence of coronary angioplasty implant and graft: Secondary | ICD-10-CM | POA: Insufficient documentation

## 2023-01-07 DIAGNOSIS — Z87442 Personal history of urinary calculi: Secondary | ICD-10-CM | POA: Insufficient documentation

## 2023-01-07 DIAGNOSIS — Z87891 Personal history of nicotine dependence: Secondary | ICD-10-CM | POA: Diagnosis not present

## 2023-01-07 DIAGNOSIS — E559 Vitamin D deficiency, unspecified: Secondary | ICD-10-CM | POA: Diagnosis not present

## 2023-01-07 DIAGNOSIS — N210 Calculus in bladder: Secondary | ICD-10-CM | POA: Insufficient documentation

## 2023-01-07 DIAGNOSIS — I10 Essential (primary) hypertension: Secondary | ICD-10-CM | POA: Diagnosis not present

## 2023-01-07 DIAGNOSIS — I2510 Atherosclerotic heart disease of native coronary artery without angina pectoris: Secondary | ICD-10-CM | POA: Insufficient documentation

## 2023-01-07 DIAGNOSIS — N201 Calculus of ureter: Secondary | ICD-10-CM | POA: Diagnosis present

## 2023-01-07 DIAGNOSIS — K219 Gastro-esophageal reflux disease without esophagitis: Secondary | ICD-10-CM | POA: Diagnosis not present

## 2023-01-07 HISTORY — PX: CYSTOSCOPY WITH RETROGRADE PYELOGRAM, URETEROSCOPY AND STENT PLACEMENT: SHX5789

## 2023-01-07 SURGERY — CYSTOURETEROSCOPY, WITH RETROGRADE PYELOGRAM AND STENT INSERTION
Anesthesia: General | Laterality: Right

## 2023-01-07 MED ORDER — KETOROLAC TROMETHAMINE 10 MG PO TABS: 10.0000 mg | ORAL_TABLET | Freq: Three times a day (TID) | ORAL | 0 refills | Status: AC | PRN

## 2023-01-07 MED ORDER — ONDANSETRON HCL 4 MG/2ML IJ SOLN
INTRAMUSCULAR | Status: DC | PRN
  Administered 2023-01-07: 4 mg via INTRAVENOUS

## 2023-01-07 MED ORDER — FENTANYL CITRATE (PF) 100 MCG/2ML IJ SOLN
INTRAMUSCULAR | Status: AC
  Filled 2023-01-07: qty 2

## 2023-01-07 MED ORDER — LACTATED RINGERS IV SOLN: INTRAVENOUS | Status: DC

## 2023-01-07 MED ORDER — MIDAZOLAM HCL 5 MG/5ML IJ SOLN
INTRAMUSCULAR | Status: DC | PRN
  Administered 2023-01-07: 2 mg via INTRAVENOUS

## 2023-01-07 MED ORDER — FENTANYL CITRATE PF 50 MCG/ML IJ SOSY: 25.0000 ug | PREFILLED_SYRINGE | INTRAMUSCULAR | Status: DC | PRN

## 2023-01-07 MED ORDER — ORAL CARE MOUTH RINSE: 15.0000 mL | Freq: Once | OROMUCOSAL | Status: AC

## 2023-01-07 MED ORDER — SODIUM CHLORIDE 0.9 % IR SOLN
Status: DC | PRN
  Administered 2023-01-07: 3000 mL via INTRAVESICAL

## 2023-01-07 MED ORDER — LIDOCAINE 2% (20 MG/ML) 5 ML SYRINGE
INTRAMUSCULAR | Status: DC | PRN
  Administered 2023-01-07: 80 mg via INTRAVENOUS

## 2023-01-07 MED ORDER — AMISULPRIDE (ANTIEMETIC) 5 MG/2ML IV SOLN: 10.0000 mg | Freq: Once | INTRAVENOUS | Status: DC | PRN

## 2023-01-07 MED ORDER — CHLORHEXIDINE GLUCONATE 0.12 % MT SOLN
15.0000 mL | Freq: Once | OROMUCOSAL | Status: AC
  Administered 2023-01-07: 15 mL via OROMUCOSAL

## 2023-01-07 MED ORDER — IOHEXOL 300 MG/ML  SOLN
INTRAMUSCULAR | Status: DC | PRN
  Administered 2023-01-07: 10 mL via URETHRAL

## 2023-01-07 MED ORDER — FENTANYL CITRATE (PF) 100 MCG/2ML IJ SOLN
INTRAMUSCULAR | Status: DC | PRN
  Administered 2023-01-07: 50 ug via INTRAVENOUS

## 2023-01-07 MED ORDER — MIDAZOLAM HCL 2 MG/2ML IJ SOLN
INTRAMUSCULAR | Status: AC
  Filled 2023-01-07: qty 2

## 2023-01-07 MED ORDER — OXYCODONE-ACETAMINOPHEN 5-325 MG PO TABS: 1.0000 | ORAL_TABLET | Freq: Four times a day (QID) | ORAL | 0 refills | Status: AC | PRN

## 2023-01-07 MED ORDER — ACETAMINOPHEN 500 MG PO TABS: 1000.0000 mg | ORAL_TABLET | Freq: Once | ORAL | Status: DC

## 2023-01-07 MED ORDER — PHENYLEPHRINE 80 MCG/ML (10ML) SYRINGE FOR IV PUSH (FOR BLOOD PRESSURE SUPPORT)
PREFILLED_SYRINGE | INTRAVENOUS | Status: DC | PRN
  Administered 2023-01-07: 160 ug via INTRAVENOUS

## 2023-01-07 MED ORDER — DEXAMETHASONE SODIUM PHOSPHATE 10 MG/ML IJ SOLN
INTRAMUSCULAR | Status: DC | PRN
  Administered 2023-01-07: 10 mg via INTRAVENOUS

## 2023-01-07 MED ORDER — PROPOFOL 10 MG/ML IV BOLUS
INTRAVENOUS | Status: AC
  Filled 2023-01-07: qty 20

## 2023-01-07 MED ORDER — PROPOFOL 10 MG/ML IV BOLUS
INTRAVENOUS | Status: DC | PRN
  Administered 2023-01-07: 160 mg via INTRAVENOUS

## 2023-01-07 MED ORDER — GENTAMICIN SULFATE 40 MG/ML IJ SOLN
5.0000 mg/kg | INTRAVENOUS | Status: AC
  Administered 2023-01-07: 42734 mg via INTRAVENOUS
  Filled 2023-01-07: qty 11.5

## 2023-01-07 SURGICAL SUPPLY — 24 items
BAG URO CATCHER STRL LF (MISCELLANEOUS) ×1 IMPLANT
BASKET LASER NITINOL 1.9FR (BASKET) IMPLANT
BSKT STON RTRVL 120 1.9FR (BASKET)
CATH URETL OPEN END 6FR 70 (CATHETERS) ×1 IMPLANT
CLOTH BEACON ORANGE TIMEOUT ST (SAFETY) ×1 IMPLANT
EXTRACTOR STONE 1.7FRX115CM (UROLOGICAL SUPPLIES) IMPLANT
GLOVE SURG LX STRL 7.5 STRW (GLOVE) ×1 IMPLANT
GOWN STRL REUS W/ TWL XL LVL3 (GOWN DISPOSABLE) ×1 IMPLANT
GOWN STRL REUS W/TWL XL LVL3 (GOWN DISPOSABLE) ×2
GUIDEWIRE ANG ZIPWIRE 038X150 (WIRE) ×1 IMPLANT
GUIDEWIRE STR DUAL SENSOR (WIRE) ×1 IMPLANT
KIT TURNOVER KIT A (KITS) IMPLANT
LASER FIB FLEXIVA PULSE ID 365 (Laser) IMPLANT
LASER FIB FLEXIVA PULSE ID 550 (Laser) IMPLANT
LASER FIB FLEXIVA PULSE ID 910 (Laser) IMPLANT
MANIFOLD NEPTUNE II (INSTRUMENTS) ×1 IMPLANT
PACK CYSTO (CUSTOM PROCEDURE TRAY) ×1 IMPLANT
SHEATH NAVIGATOR HD 11/13X28 (SHEATH) IMPLANT
SHEATH NAVIGATOR HD 11/13X36 (SHEATH) IMPLANT
TRACTIP FLEXIVA PULS ID 200XHI (Laser) IMPLANT
TRACTIP FLEXIVA PULSE ID 200 (Laser)
TUBE PU 8FR 16IN ENFIT (TUBING) ×1 IMPLANT
TUBING CONNECTING 10 (TUBING) ×1 IMPLANT
TUBING UROLOGY SET (TUBING) ×1 IMPLANT

## 2023-01-07 NOTE — H&P (Signed)
Caleb Mason is an 62 y.o. male.    Chief Complaint: Pre-OP RIGHT Ureteroscopic Stone Manipulation  HPI:   1 - Rarely Recurrent Urolithiais -  2014 - URS for solitary ureteral stone  11/2022 - 3mm Rt UVJ stone (solitary) with mild hydro by ER CT, just belo L3 TP area   2 - Prostate Screening - No FHX prostate cancer. Prostate vol 66mL with median on CT 2024.    PMH sig for obesity, CAD/MI/Stent/Brilinta (follows Dr. Caron Presume [facility]). 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 His PCP is Caleb Curio NP in [place].   Caleb Madura "Sheddrick" is seen to proceed with RIGHT ureteroscopy for solitary distal stone. No interval fevers. Most recent UA without infectious parameters.   Past Medical History:  Diagnosis Date   Adenomatous colon polyp 2013   6mm, tubular   Allergy    seasonal   Anemia    Broken hip (HCC) left   Chronic duodenal ulcer with gastric outlet obstruction 11/02/2018   Complication of anesthesia    got sick    Coronary artery disease    Diverticulosis    Essential hypertension    At night for been given official diagnosis.   GERD (gastroesophageal reflux disease)    History of kidney stones    Impaired glucose regulation    Injury    Broken hip   Kidney stones    Lumbar back pain 2011   had hip fracture that caused lower back problems => has troubles with sciatica   Myocardial infarction (HCC)    Pancreatitis    Pneumonia    PONV (postoperative nausea and vomiting)    Vitamin D deficiency     Past Surgical History:  Procedure Laterality Date   BIOPSY  09/10/2018   Procedure: BIOPSY;  Surgeon: Shellia Cleverly, DO;  [facility];  Service: Endoscopy;;   CHOLECYSTECTOMY  2008   had pancreatitis   COLONOSCOPY W/ POLYPECTOMY     CORONARY STENT INTERVENTION N/A 11/18/2022   Procedure: CORONARY STENT INTERVENTION;  Surgeon: Tonny Bollman, MD;  Location: [facility] INVASIVE CV LAB;   Service: Cardiovascular;  Laterality: N/A;   CYSTOSCOPY WITH RETROGRADE PYELOGRAM, URETEROSCOPY AND STENT PLACEMENT Right 09/15/2012   Procedure: CYSTOSCOPY WITH right  RETROGRADE PYELOGRAM, right  URETEROSCOPY AND STENT PLACEMENT;  Surgeon: Valetta Fuller, MD;  Location: [facility];  Service: Urology;  Laterality: Right;   ESOPHAGOGASTRODUODENOSCOPY (EGD) WITH PROPOFOL N/A 09/10/2018   Procedure: ESOPHAGOGASTRODUODENOSCOPY (EGD) WITH PROPOFOL;  Surgeon: Shellia Cleverly, DO;  [facility];  Service: Endoscopy;  Laterality: N/A;   HOLMIUM LASER APPLICATION Right 09/15/2012   Procedure: HOLMIUM LASER APPLICATION;  Surgeon: Valetta Fuller, MD;  Location: [facility];  Service: Urology;  Laterality: Right;   KNEE ARTHROSCOPY Right 1993   right   LEFT HEART CATH AND CORONARY ANGIOGRAPHY N/A 11/18/2022   Procedure: LEFT HEART CATH AND CORONARY ANGIOGRAPHY;  Surgeon: Tonny Bollman, MD;  Location: [facility] INVASIVE CV LAB;  Service: Cardiovascular;  Laterality: N/A;    Family History  Problem Relation Age of Onset   Heart disease Mother        Does not know the details.   Esophageal cancer Father 85       died as a result - Dx'd post-CABG   Coronary artery disease Father 59       cabg   Heart attack Paternal Grandfather 36  died   Colon cancer Neg Hx    Pancreatic cancer Neg Hx    Stomach cancer Neg Hx    Liver disease Neg Hx    Colon polyps Neg Hx    Social History:  reports that he quit smoking about 15 years ago. His smoking use included cigarettes. He has a 30.00 pack-year smoking history. He has never used smokeless tobacco. He reports that he does not drink alcohol and does not use drugs.  Allergies: No Known Allergies  No medications prior to admission.    No results found for this or any previous visit (from the past 48 hour(s)). No results found.  Review of Systems  Constitutional:  Negative for chills.  Genitourinary:  Positive for  flank pain and urgency.  All other systems reviewed and are negative.   There were no vitals taken for this visit. Physical Exam Vitals reviewed.  HENT:     Head: Normocephalic.  Eyes:     Pupils: Pupils are equal, round, and reactive to light.  Cardiovascular:     Rate and Rhythm: Normal rate.  Pulmonary:     Effort: Pulmonary effort is normal.  Abdominal:     General: Abdomen is flat.  Genitourinary:    Comments: Minimal CVAT at present Musculoskeletal:        General: Normal range of motion.     Cervical back: Normal range of motion.  Skin:    General: Skin is warm.  Neurological:     General: No focal deficit present.     Mental Status: He is alert.  Psychiatric:        Mood and Affect: Mood normal.      Assessment/Plan  Proceed as planned with ureteroscopy for small distal stone. Risks, benefits, alternatives, expected peri-op course discsused previously and reiterated today.   Loletta Parish., MD 01/07/2023, 6:54 AM

## 2023-01-07 NOTE — Op Note (Signed)
NAMEWASIF, BURRUEL MEDICAL RECORD NO: [id number] ACCOUNT NO: [id number] DATE OF BIRTH: 1960-11-03 FACILITY: [facility] LOCATION: [facility] PHYSICIAN: Sebastian Ache, MD  Operative Report   DATE OF PROCEDURE: 01/07/2023  PREOPERATIVE DIAGNOSIS:  Right proximal ureteral stone.  POSTOPERATIVE DIAGNOSIS:  Small bladder stone representing passed ureteral stone.  PROCEDURE PERFORMED: 1.  Cystoscopy with cystolitholapaxy less than 2 cm. 2.  Right retrograde pyelogram and interpretation.  COMPLICATIONS:  None.  SPECIMEN:  Right distal ureteral stone, bladder stone for chemical analysis.  DRAINS:  None.  INDICATIONS:  The patient is a 62 year old man from [place] with history of recurrent urolithiasis.  He was found on workup for colicky flank pain and to have a proximal right ureteral stone approximately 3-4 mm.  He was given appropriate trial of  medical therapy, however, his solic remained quite difficult to control.  Options were discussed including ureteroscopy versus shockwave lithotripsy.  The stone was not easily visible on plain film due to body habitus.  Therefore, elected ureteroscopy.  He  presents for this today.  Informed consent was obtained and placed in medical record.  PROCEDURE IN DETAIL:  The patient being Andriel Escutia and procedure being cystoureteroscopy was confirmed.  Procedure timeout was performed.  Intravenous antibiotics were administered.  General LMA anesthesia induced.  The patient was placed into a low  lithotomy position.  Sterile field was created, prepped and draped the patient's penis, perineum, and proximal thighs using iodine.  Cystourethroscopy performed using 21-French rigid cystoscope with offset lens. In the area of the prostatic urethra,  there was medially noted to be a calcification noted, appeared to be approximately 3-4 mm in the ovoid-shaped consistent with prior known distal ureteral stone.  This was irrigated and set aside at this point, bladder was  inspected.  There was mild  trabeculation.  Ureteral orifices were single. Right ureteral orifice was cannulated with open-ended catheter, and right retrograde pyelogram was obtained.  Right retrograde pyelogram demonstrated single right ureter, single system right kidney.  No filling defects or narrowing noted.  No hydronephrosis noted.  The entire ureteral caliber was somewhat large, but not necessarily hydronephrotic.  This was most  likely consistent with interval passage of stone.  Given the overlying evidence of this I did not feel that ureteroscopy was warranted in risks-benefit profile.  As such, the bladder was never cystoscoped.  Procedure was then terminated.  The patient  tolerated procedure well, no immediate periprocedural complications.  The patient was taken to postanesthesia care in stable condition with plan to DC home.    SHY D: 01/07/2023 4:47:28 pm T: 01/07/2023 9:27:00 pm  JOB: [id number]/ [id number]

## 2023-01-07 NOTE — Transfer of Care (Signed)
Immediate Anesthesia Transfer of Care Note  Patient: Caleb Mason  Procedure(s) Performed: CYSTOSCOPY WITH RETROGRADE PYELOGRAM, LITHOLAPAXY (Right)  Patient Location: PACU  Anesthesia Type:General  Level of Consciousness: awake, alert , and oriented  Airway & Oxygen Therapy: Patient Spontanous Breathing and Patient connected to face mask oxygen  Post-op Assessment: Report given to RN and Post -op Vital signs reviewed and stable  Post vital signs: Reviewed and stable  Last Vitals:  Vitals Value Taken Time  BP    Temp    Pulse 80 01/07/23 1657  Resp 18 01/07/23 1657  SpO2 100 % 01/07/23 1657  Vitals shown include unvalidated device data.  Last Pain:  Vitals:   01/07/23 1340  TempSrc: Oral  PainSc:          Complications: No notable events documented.

## 2023-01-07 NOTE — Brief Op Note (Signed)
01/07/2023  4:42 PM  PATIENT:  Caleb Mason  63 y.o. male  PRE-OPERATIVE DIAGNOSIS:  URETERAL STONE  POST-OPERATIVE DIAGNOSIS:  * No post-op diagnosis entered *  PROCEDURE:  Procedure(s) with comments: CYSTOSCOPY WITH RETROGRADE PYELOGRAM, URETEROSCOPY AND STENT PLACEMENT (Right) - 1 HR HOLMIUM LASER APPLICATION (Right)  SURGEON:  Surgeon(s) and Role:    * Jakoby Melendrez, Delbert Phenix., MD - Primary  PHYSICIAN ASSISTANT:   ASSISTANTS: none   ANESTHESIA:   none  EBL:  minimal   BLOOD ADMINISTERED:none  DRAINS: none   LOCAL MEDICATIONS USED:  NONE  SPECIMEN:  Source of Specimen:  Rt ureteral / bladder stone  DISPOSITION OF SPECIMEN:   [facility] for compositoinal analysis  COUNTS:  YES  TOURNIQUET:  * No tourniquets in log *  DICTATION: .1848  PLAN OF CARE: Discharge to home after PACU  PATIENT DISPOSITION:  PACU - hemodynamically stable.   Delay start of Pharmacological VTE agent (>24hrs) due to surgical blood loss or risk of bleeding: yes

## 2023-01-07 NOTE — Anesthesia Postprocedure Evaluation (Signed)
Anesthesia Post Note  Patient: Caleb Mason  Procedure(s) Performed: CYSTOSCOPY WITH RETROGRADE PYELOGRAM, LITHOLAPAXY (Right)     Patient location during evaluation: PACU Anesthesia Type: General Level of consciousness: awake and alert Pain management: pain level controlled Vital Signs Assessment: post-procedure vital signs reviewed and stable Respiratory status: spontaneous breathing, nonlabored ventilation, respiratory function stable and patient connected to nasal cannula oxygen Cardiovascular status: blood pressure returned to baseline and stable Postop Assessment: no apparent nausea or vomiting Anesthetic complications: no  No notable events documented.  Last Vitals:  Vitals:   01/07/23 1730 01/07/23 1740  BP: 125/88 (!) 141/95  Pulse: 65 66  Resp: 11   Temp: 36.4 C 36.6 C  SpO2: 98% 99%    Last Pain:  Vitals:   01/07/23 1740  TempSrc:   PainSc: 0-No pain                 Kennieth Rad

## 2023-01-07 NOTE — Anesthesia Procedure Notes (Signed)
Procedure Name: LMA Insertion Date/Time: 01/07/2023 4:24 PM  Performed by: Kizzie Fantasia, CRNAPre-anesthesia Checklist: Patient identified, Emergency Drugs available, Patient being monitored, Suction available and Timeout performed Patient Re-evaluated:Patient Re-evaluated prior to induction Oxygen Delivery Method: Circle system utilized Preoxygenation: Pre-oxygenation with 100% oxygen Induction Type: IV induction Ventilation: Mask ventilation without difficulty LMA: LMA inserted LMA Size: 4.0 Number of attempts: 1 Placement Confirmation: positive ETCO2 and breath sounds checked- equal and bilateral Tube secured with: Tape Dental Injury: Teeth and Oropharynx as per pre-operative assessment

## 2023-01-07 NOTE — Discharge Instructions (Signed)
1 - You may have urinary urgency (bladder spasms) and bloody urine on / off for up to a week. This is normal.  2 - Call MD or go to ER for fever >102, severe pain / nausea / vomiting not relieved by medications, or acute change in medical status  

## 2023-01-08 ENCOUNTER — Encounter (HOSPITAL_COMMUNITY): Payer: Self-pay | Admitting: Urology

## 2023-03-03 ENCOUNTER — Ambulatory Visit: Payer: BC Managed Care – PPO | Admitting: General Practice

## 2023-03-06 NOTE — Progress Notes (Unsigned)
Cardiology Clinic Note   Patient Name: Caleb Mason Date of Encounter: 03/11/2023  Primary Care Provider:  Delorse Lek, FNP Primary Cardiologist:  Bryan Lemma, MD  Patient Profile     Caleb Mason 62 year old male presents to the clinic today for follow-up evaluation of his essential hypertension.  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   GERD (gastroesophageal reflux disease)    History of kidney stones    Impaired glucose regulation    Injury    Broken hip   Kidney stones    Lumbar back pain 2011   had hip fracture that caused lower back problems => has troubles with sciatica   Myocardial infarction (HCC)    Pancreatitis    Pneumonia    PONV (postoperative nausea and vomiting)    Vitamin D deficiency    Past Surgical History:  Procedure Laterality Date   BIOPSY  09/10/2018   Procedure: BIOPSY;  Surgeon: Shellia Cleverly, DO;  Location: [facility];  Service: Endoscopy;;   CHOLECYSTECTOMY  2008   had pancreatitis   COLONOSCOPY W/ POLYPECTOMY     CORONARY STENT INTERVENTION N/A 11/18/2022   Procedure: CORONARY STENT INTERVENTION;  Surgeon: Tonny Bollman, MD;  Location: [facility] INVASIVE CV LAB;  Service: Cardiovascular;  Laterality: N/A;   CYSTOSCOPY WITH RETROGRADE PYELOGRAM, URETEROSCOPY AND STENT PLACEMENT Right 09/15/2012   Procedure: CYSTOSCOPY WITH right  RETROGRADE PYELOGRAM, right  URETEROSCOPY AND STENT PLACEMENT;  Surgeon: Valetta Fuller, MD;  Location: [facility];  Service: Urology;  Laterality: Right;   CYSTOSCOPY WITH RETROGRADE PYELOGRAM, URETEROSCOPY AND STENT PLACEMENT Right  01/07/2023   Procedure: CYSTOSCOPY WITH RETROGRADE PYELOGRAM, LITHOLAPAXY;  Surgeon: Loletta Parish., MD;  Location: [facility];  Service: Urology;  Laterality: Right;  1 HR   ESOPHAGOGASTRODUODENOSCOPY (EGD) WITH PROPOFOL N/A 09/10/2018   Procedure: ESOPHAGOGASTRODUODENOSCOPY (EGD) WITH PROPOFOL;  Surgeon: Shellia Cleverly, DO;  Location: [facility];  Service: Endoscopy;  Laterality: N/A;   HOLMIUM LASER APPLICATION Right 09/15/2012   Procedure: HOLMIUM LASER APPLICATION;  Surgeon: Valetta Fuller, MD;  Location: [facility];  Service: Urology;  Laterality: Right;   KNEE ARTHROSCOPY Right 1993   right   LEFT HEART CATH AND CORONARY ANGIOGRAPHY N/A 11/18/2022   Procedure: LEFT HEART CATH AND CORONARY ANGIOGRAPHY;  Surgeon: Tonny Bollman, MD;  Location: [facility] INVASIVE CV LAB;  Service: Cardiovascular;  Laterality: N/A;    Allergies  No Known Allergies  History of Present Illness     Caleb Mason has a PMH of essential hypertension, hiatal hernia, GERD, metabolic syndrome, HLD, and mild anemia.  He has a family history of premature coronary artery disease.  His father was diagnosed with CAD at age 59 and passed away recently.  After his father's passing he has been more aware/focused on his health.  He was initially referred to Dr. Herbie Baltimore for evaluation and help with management of his blood pressure.  He presented to the clinic on 05/12/2022.  During that time he reported blood pressures with systolic in the  170 range.  He had been eating poorly and not sleeping well.  His CBC showed an elevated glucose of 173, renal function  stable.  His lipid panel showed overall cholesterol of 191 and a LDL of 102 with triglycerides of 256.  It was felt that his lifestyle could be adjusted and no medications were initiated at that time.  He presented to the clinic 06/23/22 for follow-up evaluation and stated he felt well.  He returned from a hunting trip where he was climbing up and down hills  and valleys.  He did well and denied exertional chest discomfort.  He had lost around 30 pounds and his blood pressure was is around 142/80-70.  He had been getting similar readings at home.  He planned to lose another 45 pounds.  He had made strict adjustments with his diet and remained very physically active farming.   He was admitted on 11/18/2022 and discharged on 11/20/2022.  He reported chest discomfort that was accompanied by diaphoresis.  He had presented to his PCP who noted an inferior STEMI and also inferior Q waves.  EMS transported to [place].  It was felt that he had late presenting MI.  He underwent cardiac catheterization 11/18/2022 and was noted to have distal RCA 100% stenosed, proximal RCA 30% stenosed, proximal circumflex-mid circumflex 40%, proximal LAD-mid LAD 30%.  He received PCI with DES x 1.  Echocardiogram 11/18/2022 showed an EF of 50-55%, moderate LVH, and intermediate diastolic parameters.  No significant valvular abnormalities were noted.  He was placed on dual antiplatelet platelet therapy.  He was last seen in clinic on 12/02/2022 for follow-up.  He reported ongoing coughing since being home from the hospital.  Appeared to be a upper respiratory virus.  He was encouraged to increase his physical activity. Per patient he ended up having pneumonia. He also reports that he had to have a kidney stone removed in July, has been doing well following.   Today he presents for follow-up with his wife.  He reports that he is doing well, notes some ongoing fatigue that he feels is improving following his stent placement. He and his wife recently returned from the beach, stayed for a week. He went fishing and played with grandchildren. 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  Reports that following his cessation he worked towards heart healthy diet which then resulted in a kidney stone.  Reports he does maintain a low-salt diet.  Today he denies chest pain, shortness  of breath, lower extremity edema,  palpitations, melena, hematuria, hemoptysis, diaphoresis, weakness, presyncope, syncope, orthopnea, and PND. Home Medications    Prior to Admission medications   Medication Sig Start Date End Date Taking? Authorizing Provider  cholecalciferol (VITAMIN D3) 25 MCG (1000 UNIT) tablet Take 1,000 Units by mouth once a week.    [provider]  pantoprazole (PROTONIX) 40 MG tablet TAKE 1 TABLET BY MOUTH ABOUT 30 MINUTES BEFORE BREAKFAST 04/22/22   Iva Boop, MD    Family History    Family History  Problem Relation Age of Onset   Heart disease Mother        Does not know the details.   Esophageal cancer Father 84       died as a result - Dx'd post-CABG   Coronary artery disease Father 71       cabg   Heart attack Paternal Grandfather 81       died   Colon cancer Neg Hx  Pancreatic cancer Neg Hx    Stomach cancer Neg Hx    Liver disease Neg Hx    Colon polyps Neg Hx    He indicated that his mother is alive. He indicated that his father is deceased. He indicated that his paternal grandfather is deceased. He indicated that the status of his neg hx is unknown.  Social History    Social History   Socioeconomic History   Marital status: Married    Spouse name: Not on file   Number of children: 2   Years of education: Not on file   Highest education level: Associate degree: occupational, Scientist, product/process development, or vocational program  Occupational History   Occupation: Careers information officer  Tobacco Use   Smoking status: Former    Current packs/day: 0.00    Average packs/day: 1 pack/day for 30.0 years (30.0 ttl pk-yrs)    Types: Cigarettes    Start date: 09/14/1977    Quit date: 09/15/2007    Years since quitting: 15.4   Smokeless tobacco: Never  Vaping Use   Vaping status: Never Used  Substance and Sexual Activity   Alcohol use: No    Alcohol/week: 0.0 standard drinks of alcohol   Drug use: No   Sexual activity: Yes    Partners: Female  Other  Topics Concern   Not on file  Social History Narrative   Married with 2 sons; lives with his wife.  Identifies himself as a male.   Armed forces operational officer and farmer - [place]   4 caffeinated beverages qd   04/03/2015      Works as an Insurance account manager.  Oftentimes works extra shifts.  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      As of May 12, 2022: Exercises roughly 7 days a week for least an hour.   Has given up fast food, eating much better.  Giving up sweets.  Getting better sleep.  Trying to work better hours. => Has lost 15 pounds.  Also noted improvement in blood pressure from the 170s to 150/90 today.         Social Determinants of Health   Financial Resource Strain: Not on file  Food Insecurity: Not on file  Transportation Needs: Not on file  Physical Activity: Not on file  Stress: Not on file  Social Connections: Not on file  Intimate Partner Violence: Not on file     Review of Systems    General:  No chills, fever, night sweats or weight changes.  Cardiovascular:  No chest pain, dyspnea on exertion, edema, orthopnea, palpitations, paroxysmal nocturnal dyspnea. Dermatological: No rash, lesions/masses Respiratory: No cough, dyspnea Urologic: No hematuria, dysuria Abdominal:   No nausea, vomiting, diarrhea, bright red blood per rectum, melena, or hematemesis Neurologic:  No visual changes, wkns, changes in mental status. All other systems reviewed and are otherwise negative except as noted above.  Physical Exam    VS:  BP 126/86   Pulse 85   Ht 6\' 1"  (1.854 m)   Wt 265 lb 12.8 oz (120.6 kg)   SpO2 97%   BMI 35.07 kg/m  , BMI Body mass index is 35.07 kg/m. GEN: Well nourished, well developed, in no acute distress. HEENT: normal. Neck: Supple, no JVD, carotid bruits, or masses. Cardiac: RRR, no murmurs, rubs, or gallops. No clubbing, cyanosis, edema.  Radials/DP/PT 2+ and equal bilaterally.  Respiratory:  Respirations  regular and unlabored, clear to auscultation bilaterally. GI: Soft, nontender, nondistended, BS + x  4. MS: no deformity or atrophy. Skin: warm and dry, no rash.   Neuro:  Strength and sensation are intact. Psych: Normal affect.  Accessory Clinical Findings    Recent Labs: 10/27/2022: ALT 33 12/30/2022: BUN 18; Creatinine, Ser 0.81; Hemoglobin 14.4; Platelets 224; Potassium 3.7; Sodium 136   Recent Lipid Panel    Component Value Date/Time   CHOL 123 11/19/2022 0316   TRIG 89 11/19/2022 0316   HDL 35 (L) 11/19/2022 0316   CHOLHDL 3.5 11/19/2022 0316   VLDL 18 11/19/2022 0316   LDLCALC 70 11/19/2022 0316      ECG personally reviewed by me today- none today.  IMPRESSIONS   1. Left ventricular ejection fraction, by estimation, is 50 to 55%. The  left ventricle has low normal function. The left ventricle demonstrates  regional wall motion abnormalities (see scoring diagram/findings for  description). There is moderate  concentric left ventricular hypertrophy. Left ventricular diastolic  parameters are indeterminate.   2. Right ventricular systolic function is normal. The right ventricular  size is normal.   3. The mitral valve is normal in structure. Mild mitral valve  regurgitation. No evidence of mitral stenosis.   4. The aortic valve is normal in structure. Aortic valve regurgitation is  not visualized. No aortic stenosis is present.   5. The inferior vena cava is normal in size with greater than 50%  respiratory variability, suggesting right atrial pressure of 3 mmHg.   FINDINGS   Left Ventricle: Left ventricular ejection fraction, by estimation, is 50  to 55%. The left ventricle has low normal function. The left ventricle  demonstrates regional wall motion abnormalities. Definity contrast agent  was given IV to delineate the left  ventricular endocardial borders. The left ventricular internal cavity size  was normal in size. There is moderate concentric left ventricular   hypertrophy. Left ventricular diastolic parameters are indeterminate.     LV Wall Scoring:  The basal inferolateral segment and basal inferior segment are akinetic.  The  entire anterior wall, antero-lateral wall, mid and distal lateral wall,  entire septum, entire apex, and mid and distal inferior wall are  hypokinetic.   Right Ventricle: The right ventricular size is normal. No increase in  right ventricular wall thickness. Right ventricular systolic function is  normal.   Left Atrium: Left atrial size was normal in size.   Right Atrium: Right atrial size was normal in size.   Pericardium: There is no evidence of pericardial effusion. Presence of  epicardial fat layer.   Mitral Valve: The mitral valve is normal in structure. Mild mitral valve  regurgitation. No evidence of mitral valve stenosis.   Tricuspid Valve: The tricuspid valve is normal in structure. Tricuspid  valve regurgitation is not demonstrated. No evidence of tricuspid  stenosis.   Aortic Valve: The aortic valve is normal in structure. Aortic valve  regurgitation is not visualized. No aortic stenosis is present.   Pulmonic Valve: The pulmonic valve was normal in structure. Pulmonic valve  regurgitation is not visualized. No evidence of pulmonic stenosis.   Aorta: The aortic root is normal in size and structure.   Venous: The inferior vena cava is normal in size with greater than 50%  respiratory variability, suggesting right atrial pressure of 3 mmHg.   IAS/Shunts: No atrial level shunt detected by color flow Doppler.   Cardiac catheterization   Dist RCA lesion is 100% stenosed.   Prox RCA lesion is 30% stenosed.   Prox Cx to Mid Cx lesion is  40% stenosed.   Prox LAD to Mid LAD lesion is 30% stenosed.   A drug-eluting stent was successfully placed using a SYNERGY XD 3.0X16.   Post intervention, there is a 0% residual stenosis.   The left ventricular systolic function is normal.   LV end diastolic  pressure is mildly elevated.   The left ventricular ejection fraction is 50-55% by visual estimate.   1.  Patent left main with no obstructive disease 2.  Patent LAD to the apex with mild proximal and mid vessel plaquing and stenosis up to 30 to 40% 3.  Patent left circumflex with nonobstructive plaquing of 30 to 40% extending from the proximal circumflex into the first OM 4.  Total occlusion of the distal RCA, treated successfully with PCI using a 3.0 x 16 mm Synergy DES, postdilated with a 3.5 mm noncompliant balloon.  100% occlusion and TIMI 0 flow at baseline, 0% residual stenosis and TIMI-3 flow post PCI 5.  Poorly visualized endocardial borders, but apparent normal LV EF estimated at 50 to 55% with hypokinesis of the inferior wall   Recommendations: 2D echo for better assessment of post-MI LV function, ASA and ticagrelor x 12 months without interruption, post-MI medical therapy. Consider fast track DC tomorrow if remains medically stable.  Diagnostic Dominance: Right  Intervention     Assessment & Plan   STEMI-Underwent cardiac catheterization on 11/18/2022.  Was noted to have 100% occlusion of distal RCA.  Received DES x 1.  Details above. Stable with no anginal symptoms. No indication for ischemic evaluation.   Heart healthy diet and regular cardiovascular exercise encouraged.   Continue aspirin, Brilinta, rosuvastatin Increase physical activity as tolerated  Pericarditis-  Finished his course of colchicine. He denies further chest pain.  No evidence of pericardial effusion on echo during admission   Essential hypertension-BP today Home blood pressures per patient run 120/70 at home.  Maintain blood pressure log Continue weight loss Heart healthy low-sodium diet Increase physical activity as tolerated  Hyperlipidemia 11/19/2022: Cholesterol 123; HDL 35; LDL Cholesterol 70; Triglycerides 89; VLDL 18.  Discussed with patient that overall would recommend a LDL goal of less  than 55.  He notes that he was started on Crestor 20 mg daily a few weeks prior to his heart attack. Will recheck fasting lipid profile and LFTs.  Continue Crestor 20 mg daily pending results of lipid profile.   Left ventricular hypertrophy Echo following STEMI in 11/2022 indicated LVEF of 50 to 55%, LV demonstrating regional wall motion abnormalities.  There was moderate concentric LVH.  He denies shortness of breath or lower extremity edema.  Will have him repeat echocardiogram to reassess LVEF and LVH.  Family history of premature coronary disease-father recently passed away at age 74.  He was diagnosed with CAD.  Disposition: Follow up with Reather Littler, NP in three months.   [place] D. Shelva Majestic, NP     03/11/2023, 3:54 PM [facility] [address] Office [phone] Fax [phone]

## 2023-03-11 ENCOUNTER — Encounter: Payer: Self-pay | Admitting: General Practice

## 2023-03-11 ENCOUNTER — Ambulatory Visit: Payer: BC Managed Care – PPO | Attending: General Practice | Admitting: Cardiology

## 2023-03-11 VITALS — BP 126/86 | HR 85 | Ht 73.0 in | Wt 265.8 lb

## 2023-03-11 DIAGNOSIS — I517 Cardiomegaly: Secondary | ICD-10-CM

## 2023-03-11 DIAGNOSIS — I10 Essential (primary) hypertension: Secondary | ICD-10-CM | POA: Diagnosis not present

## 2023-03-11 DIAGNOSIS — I2510 Atherosclerotic heart disease of native coronary artery without angina pectoris: Secondary | ICD-10-CM

## 2023-03-11 DIAGNOSIS — Z8249 Family history of ischemic heart disease and other diseases of the circulatory system: Secondary | ICD-10-CM | POA: Diagnosis not present

## 2023-03-11 DIAGNOSIS — E785 Hyperlipidemia, unspecified: Secondary | ICD-10-CM

## 2023-03-11 NOTE — Patient Instructions (Signed)
Medication Instructions:  The current medical regimen is effective;  continue present plan and medications as directed. Please refer to the Current Medication list given to you today.  *If you need a refill on your cardiac medications before your next appointment, please call your pharmacy*  Lab Work: FASTING LIPID AND LFT NEXT WEEK If you have labs (blood work) drawn today and your tests are completely normal, you will receive your results only by:  MyChart Message (if you have MyChart) OR  A paper copy in the mail If you have any lab test that is abnormal or we need to change your treatment, we will call you to review the results.  Testing/Procedures: Your physician has requested that you have an echocardiogram. Echocardiography (1st AVAILABLE) is a painless test that uses sound waves to create images of your heart. It provides your doctor with information about the size and shape of your heart and how well your heart's chambers and valves are working. This procedure takes approximately one hour. There are no restrictions for this procedure. Please do NOT wear cologne, perfume, aftershave, or lotions (deodorant is allowed). Please arrive 15 minutes prior to your appointment time.   Follow-Up: At [facility], you and your health needs are our priority.  As part of our continuing mission to provide you with exceptional heart care, we have created designated Provider Care Teams.  These Care Teams include your primary Cardiologist (physician) and Advanced Practice Providers (APPs -  Physician Assistants and Nurse Practitioners) who all work together to provide you with the care you need, when you need it.  Your next appointment:   3 month(s)  Provider:   Reather Littler         Other Instructions

## 2023-03-31 ENCOUNTER — Ambulatory Visit (HOSPITAL_COMMUNITY): Payer: BC Managed Care – PPO | Attending: Cardiology

## 2023-03-31 ENCOUNTER — Other Ambulatory Visit (HOSPITAL_COMMUNITY)
Admission: RE | Admit: 2023-03-31 | Discharge: 2023-03-31 | Disposition: A | Discharge status: Home / Self Care | Payer: BC Managed Care – PPO | Source: Ambulatory Visit | Attending: Cardiology | Admitting: Cardiology

## 2023-03-31 DIAGNOSIS — I517 Cardiomegaly: Secondary | ICD-10-CM | POA: Diagnosis present

## 2023-03-31 DIAGNOSIS — I119 Hypertensive heart disease without heart failure: Secondary | ICD-10-CM | POA: Insufficient documentation

## 2023-03-31 DIAGNOSIS — Z8249 Family history of ischemic heart disease and other diseases of the circulatory system: Secondary | ICD-10-CM | POA: Insufficient documentation

## 2023-03-31 DIAGNOSIS — E785 Hyperlipidemia, unspecified: Secondary | ICD-10-CM | POA: Diagnosis not present

## 2023-03-31 DIAGNOSIS — I2510 Atherosclerotic heart disease of native coronary artery without angina pectoris: Secondary | ICD-10-CM | POA: Diagnosis present

## 2023-03-31 LAB — LIPID PANEL
Cholesterol: 132 mg/dL (ref 0–200)
HDL: 45 mg/dL (ref >40)
LDL Cholesterol: 62 mg/dL (ref 0–99)
Total CHOL/HDL Ratio: 2.9 RATIO
Triglycerides: 124 mg/dL (ref <150)
VLDL: 25 mg/dL (ref 0–40)

## 2023-03-31 LAB — HEPATIC FUNCTION PANEL
ALT: 32 U/L (ref 0–44)
AST: 25 U/L (ref 15–41)
Albumin: 3.8 g/dL (ref 3.5–5.0)
Alkaline Phosphatase: 54 U/L (ref 38–126)
Bilirubin, Direct: 0.1 mg/dL (ref 0.0–0.2)
Indirect Bilirubin: 0.6 mg/dL (ref 0.3–0.9)
Total Bilirubin: 0.7 mg/dL (ref 0.3–1.2)
Total Protein: 7 g/dL (ref 6.5–8.1)

## 2023-03-31 LAB — ECHOCARDIOGRAM COMPLETE
Area-P 1/2: 3.95 cm2
S' Lateral: 3.1 cm

## 2023-03-31 MED ORDER — PERFLUTREN LIPID MICROSPHERE
1.0000 mL | INTRAVENOUS | Status: AC | PRN
  Administered 2023-03-31: 2 mL via INTRAVENOUS

## 2023-04-01 ENCOUNTER — Other Ambulatory Visit (HOSPITAL_COMMUNITY): Payer: Self-pay

## 2023-04-01 ENCOUNTER — Other Ambulatory Visit: Payer: Self-pay

## 2023-04-01 ENCOUNTER — Encounter (HOSPITAL_COMMUNITY): Payer: Self-pay | Admitting: Pharmacist

## 2023-04-01 ENCOUNTER — Telehealth: Payer: Self-pay

## 2023-04-01 DIAGNOSIS — E785 Hyperlipidemia, unspecified: Secondary | ICD-10-CM

## 2023-04-01 DIAGNOSIS — I10 Essential (primary) hypertension: Secondary | ICD-10-CM

## 2023-04-01 DIAGNOSIS — I2510 Atherosclerotic heart disease of native coronary artery without angina pectoris: Secondary | ICD-10-CM

## 2023-04-01 MED ORDER — ROSUVASTATIN CALCIUM 20 MG PO TABS
40.0000 mg | ORAL_TABLET | Freq: Every day | ORAL | 1 refills | Status: DC
Start: 2023-04-01 — End: 2023-04-01
  Filled 2023-04-01: qty 90, 45d supply, fill #0

## 2023-04-01 MED ORDER — ROSUVASTATIN CALCIUM 40 MG PO TABS
40.0000 mg | ORAL_TABLET | Freq: Every day | ORAL | 0 refills | Status: AC
Start: 2023-04-01 — End: 2023-06-30
  Filled 2023-04-01: qty 30, 30d supply, fill #0

## 2023-04-01 NOTE — Progress Notes (Signed)
Patient RX for Rosuvastatin 20 mg Two times a day is not covered by insurance.  The Rosuvastatin 40 mg was called in for 90 days.  Once labs are done at the two month mark, the RX can be sent with dose needed based on lab results. My chart message sent to patient

## 2023-04-01 NOTE — Telephone Encounter (Signed)
Called patient with lab results.  He states he does not think he needs increase in his Crestor but agrees until labs a drawn in 2 months.  New RX sent for 20 mg take 2 tablets.  He did not want a 40 mg tablet because thinks it will be reduced back to 20 mg

## 2023-04-08 ENCOUNTER — Other Ambulatory Visit (HOSPITAL_COMMUNITY): Payer: Self-pay

## 2023-04-09 ENCOUNTER — Other Ambulatory Visit (HOSPITAL_COMMUNITY): Payer: Self-pay

## 2023-06-18 ENCOUNTER — Ambulatory Visit: Payer: BC Managed Care – PPO | Admitting: Cardiology
